# Patient Record
Sex: Female | Born: 1980 | Hispanic: Yes | Marital: Married | Smoking: Never smoker
Health system: Southern US, Community
[De-identification: ages and names within clinical notes are randomized; demographics above are authoritative.]

## PROBLEM LIST (undated history)

## (undated) DIAGNOSIS — Z8742 Personal history of other diseases of the female genital tract: Secondary | ICD-10-CM

## (undated) DIAGNOSIS — O1490 Unspecified pre-eclampsia, unspecified trimester: Secondary | ICD-10-CM

## (undated) DIAGNOSIS — L0291 Cutaneous abscess, unspecified: Secondary | ICD-10-CM

## (undated) DIAGNOSIS — Z789 Other specified health status: Secondary | ICD-10-CM

## (undated) DIAGNOSIS — N83201 Unspecified ovarian cyst, right side: Secondary | ICD-10-CM

## (undated) HISTORY — PX: APPENDECTOMY: SHX54

## (undated) HISTORY — DX: Unspecified ovarian cyst, right side: N83.201

## (undated) HISTORY — DX: Personal history of other diseases of the female genital tract: Z87.42

## (undated) HISTORY — DX: Unspecified pre-eclampsia, unspecified trimester: O14.90

## (undated) HISTORY — DX: Cutaneous abscess, unspecified: L02.91

## (undated) HISTORY — PX: INCISION AND DRAINAGE: SHX5863

---

## 2002-02-27 ENCOUNTER — Encounter: Payer: Self-pay | Admitting: *Deleted

## 2002-02-27 ENCOUNTER — Emergency Department (HOSPITAL_COMMUNITY): Admission: EM | Admit: 2002-02-27 | Discharge: 2002-02-27 | Payer: Self-pay | Admitting: *Deleted

## 2004-05-26 ENCOUNTER — Emergency Department (HOSPITAL_COMMUNITY): Admission: EM | Admit: 2004-05-26 | Discharge: 2004-05-26 | Payer: Self-pay | Admitting: Emergency Medicine

## 2004-06-06 ENCOUNTER — Ambulatory Visit: Payer: Self-pay | Admitting: Nurse Practitioner

## 2004-07-14 ENCOUNTER — Ambulatory Visit: Payer: Self-pay | Admitting: *Deleted

## 2004-08-11 ENCOUNTER — Ambulatory Visit (HOSPITAL_COMMUNITY): Admission: RE | Admit: 2004-08-11 | Discharge: 2004-08-11 | Payer: Self-pay | Admitting: Obstetrics and Gynecology

## 2004-08-18 ENCOUNTER — Ambulatory Visit: Payer: Self-pay | Admitting: Family Medicine

## 2004-09-22 ENCOUNTER — Ambulatory Visit: Payer: Self-pay | Admitting: Family Medicine

## 2004-10-26 ENCOUNTER — Ambulatory Visit: Payer: Self-pay | Admitting: Family Medicine

## 2004-11-30 ENCOUNTER — Ambulatory Visit: Payer: Self-pay | Admitting: Family Medicine

## 2005-01-12 ENCOUNTER — Ambulatory Visit (HOSPITAL_COMMUNITY): Admission: RE | Admit: 2005-01-12 | Discharge: 2005-01-12 | Payer: Self-pay | Admitting: *Deleted

## 2005-04-05 ENCOUNTER — Ambulatory Visit: Payer: Self-pay | Admitting: Family Medicine

## 2005-08-02 ENCOUNTER — Ambulatory Visit: Payer: Self-pay | Admitting: Obstetrics and Gynecology

## 2005-11-02 ENCOUNTER — Ambulatory Visit: Payer: Self-pay | Admitting: Obstetrics & Gynecology

## 2006-08-19 ENCOUNTER — Ambulatory Visit: Payer: Self-pay

## 2010-01-11 ENCOUNTER — Ambulatory Visit: Payer: Self-pay | Admitting: Gynecology

## 2010-01-24 ENCOUNTER — Ambulatory Visit: Payer: Self-pay | Admitting: Gynecology

## 2010-07-07 NOTE — Group Therapy Note (Signed)
NAME:  Eileen [name], Eileen [name] NO.:  [id number]   MEDICAL RECORD NO.:  [id number]          PATIENT TYPE:  WOC   LOCATION:  [facility]                   FACILITY:  [facility]   PHYSICIAN:  Tinnie Gens, MD        DATE OF BIRTH:  1981/01/28   DATE OF SERVICE:                                    CLINIC NOTE   CHIEF COMPLAINT:  Infertility followup.   HISTORY OF PRESENT ILLNESS:  The patient is a 30 year old nulligravida who  has been on Clomid, and had regular cycles since June.  Her LMP is November 24, 2004.  Her partner has undergone semen analysis which has a volume of 1.5  mL, normal pH, normal concentration, good movement and 60% motility and a  total sperm count of 147 million.  There may be increase in abnormal heads,  but otherwise very normal-looking semen analysis.  The patient has not had  her HSG.  She states she was not told to call us about that.  I did offer  her continued treatment with Clomid, but she would definitely want some HSG  at this time.  The patient is instructed to call with her next menses to get  this scheduled.  The patient is also complaining today of some bilateral  heel pain.  When she works, she is on her feet most of the day.  The patient  was instructed about antiinflammatories, heel cups as well as rolling the  ball of her foot over cold cans.  The patient is also complaining of a fall  where she had an injury to her wrist and some back pain.  I have instructed  her that ibuprofen can be used for these sorts of pains and discomforts as  well.  She should continue this for the next week.  If her pain and symptoms  fail to improve, she should see a regular medical doctor.           ______________________________  Tinnie Gens, MD     TP/MEDQ  D:  11/30/2004  T:  11/30/2004  Job:  [id number]

## 2010-07-07 NOTE — Group Therapy Note (Signed)
NAME:  Eileen [name], PORTA NO.:  [id number]   MEDICAL RECORD NO.:  [id number]          PATIENT TYPE:  WOC   LOCATION:  [facility]                   FACILITY:  [facility]   PHYSICIAN:  Tinnie Gens, MD        DATE OF BIRTH:  1981-02-17   DATE OF SERVICE:                                    CLINIC NOTE   CHIEF COMPLAINT:  Infertility followup.   HISTORY OF PRESENT ILLNESS:  The patient is a 30 year old nulligravida, who  has been on Clomid for the past three months.  Since that time, she has had  regular cycles in June, July and August.  However, she has failed to achieve  pregnancy.  She reports that she is having sex during the appropriate time.  We have not done a complete workup on her to include HSG and semen analysis  and that needs to be initiated.   The patient also complains today of sensitivity and possible pain in her  right nipple.  She noticed it at first after she fell on it several months  ago, but it has not really gotten better.   PHYSICAL EXAMINATION:  VITAL SIGNS:  As on the chart.  GENERAL:  She is an obese Hispanic female, in no acute distress.  BREASTS:  Symmetric with everted nipples.  There is no significant pain to  palpation and no masses noted.  There is no axillary or supraclavicular  adenopathy.  ABDOMEN:  Soft, nontender and nondistended.   IMPRESSION:  Primary infertility.   PLAN:  1.  The patient has failed conservative treatment with just Clomid alone,      even though this has induced ovulation per her.  2.  HSG.  The patient is instructed to call if she starts her cycle and we      will make an appointment for an HSG.  3.  The patient's partner is referred for semen analysis.  4.  Followup in 4-6 weeks for results of all of these tests and discussion      of further treatment options.           ______________________________  Tinnie Gens, MD     TP/MEDQ  D:  10/26/2004  T:  10/27/2004  Job:  [id number]

## 2010-07-07 NOTE — Group Therapy Note (Signed)
NAME:  Eileen [name], Eileen [name] NO.:  [id number]   MEDICAL RECORD NO.:  [id number]          PATIENT TYPE:  WOC   LOCATION[facility]                   FACILITY:  [facility]   PHYSICIAN:  Argentina Donovan, MD        DATE OF BIRTH:  1980-03-01   DATE OF SERVICE:  08/02/2005                                    CLINIC NOTE   This patient is a 30 year old nulligravida who has been seen by Dr. Shawnie Pons  for infertility and trying to get pregnant for 6 years.  In discussing with  this patient, who speaks only Spanish, and looking at her temperature chart,  it is obvious that she has not understood well how to proceed.  We are going  to start from the beginning with this lady with a temperature chart with  Glucophage, which she has not taken in spite of being given a prior  prescription for it in the past, b.i.d. 500 and Clomid b.i.d. day 5 through  9.  We will see her back after the second cycle.  If she has not ovulated,  we will try her on 150.  If that has not worked, we will try her with hCG  following with Clomid.  If that does not work, we will send her to someone  else __________ researched into ovulation.  She has a completely flat,  nonovulatory chart, but has been lax in putting down dates and charting  coitus; we have gone over all that with her with data and hopefully she will  understand.  Primary infertility with polycystic ovarian syndrome.  Her  husband has had a normal sperm evaluation.  She has had a normal  hysterosalpingogram.           ______________________________  Argentina Donovan, MD     PR/MEDQ  D:  08/02/2005  T:  08/02/2005  Job:  [id number]

## 2010-07-07 NOTE — Group Therapy Note (Signed)
NAME:  JEYMI, HEPP NO.:  [id number]   MEDICAL RECORD NO.:  [id number]          PATIENT TYPE:  WOC   LOCATION[facility]                   FACILITY:  [facility]   PHYSICIAN:  Kathlyn Sacramento, M.D.   DATE OF BIRTH:  06/01/1980   DATE OF SERVICE:                                    CLINIC NOTE   CHIEF COMPLAINT:  Followup infertility, PCOS.   HISTORY OF PRESENT ILLNESS:  The patient states that she has tried for 6  years to get pregnant.  She has been on Clomid.  She says the last time she  used Clomid was about November.  She does state that she is now having  regular periods.  She is having intercourse regularly.  She states she did  have an HSG, which was negative, and she did have a semen workup for her  partner, which was normal.  The patient had multiple ovarian cysts seen on  an ultrasound in the past.   IMPRESSION:  Polycystic ovarian syndrome.   PLAN:  Discussed with the patient multiple options.  As the patient is  having regular periods, she may be ovulating, so the patient was given a  temperature chart and was told to get a basal temperature thermometer to  determine if she is ovulating.  If she is not ovulating, she was given a  prescription for Clomid to begin to take on days 5-9 of her cycle.  She was  encouraged to continue exercise.  She has lost some weight and she was  encouraged to decrease the amount of sugars and starches she eats in her  diet.  She was also given a prescription for Glucophage, which she was told  to take every day 500 mg 1 p.o. b.i.d.  She is to return in 4-5 months for a  followup.  Plan was discussed with Dr. Shawnie Pons.           ______________________________  Kathlyn Sacramento, M.D.     AC/MEDQ  D:  04/05/2005  T:  04/05/2005  Job:  [id number]
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
## 2010-07-07 NOTE — Group Therapy Note (Signed)
NAME:  Eileen [name], Eileen [name] NO.:  [id number]   MEDICAL RECORD NO.:  [id number]          PATIENT TYPE:  WOC   [facility]ATION[facility]                   [facility]   PHYSICIAN:  Tinnie Gens, MD        DATE OF BIRTH:  [birth year]   DATE OF SERVICE:  08/18/2004                                    CLINIC NOTE   CHIEF COMPLAINT:  Follow-up.   HISTORY OF PRESENT ILLNESS:  The patient is a 30 year old who has irregular  cycles who apparently had some sort of CT that showed bilateral complex  adnexal structures. The patient apparently is trying to get pregnant  currently. At her last visit, a TSH and prolactin were drawn which were  normal. She also underwent sonogram which revealed ovaries with bilateral  increase in size and multiple follicles contained within them consistent  with PCO.   PHYSICAL EXAMINATION TODAY:  VITAL SIGNS:  As noted in the chart.  GENERAL:  She is a well-developed, well-nourished Hispanic female in no  acute distress.  ABDOMEN:  Soft, nontender, nondistended.   IMPRESSION:  1.  Probable polycystic ovary syndrome.  2.  Infertility.   PLAN:  1.  Provera one p.o. daily x10 days to initiate menses.  2.  Clomid 50 mg one p.o. daily x5 days start day #5-9 of her next cycle.  3.  The patient is instructed to have sex between day #9 and day #20.  4.  The patient will return in 5 weeks either having had a cycle which meant      she ovulated or having no cycle and needing a pregnancy test.   I have discussed with her we will try this for a few cycles and if this does  not work she will need further workup including semen analysis and HSG.       TP/MEDQ  D:  08/18/2004  T:  08/18/2004  Job:  [id number]

## 2015-11-08 ENCOUNTER — Other Ambulatory Visit: Payer: Self-pay | Admitting: Adult Health

## 2015-11-16 ENCOUNTER — Ambulatory Visit (INDEPENDENT_AMBULATORY_CARE_PROVIDER_SITE_OTHER): Payer: Self-pay | Admitting: Adult Health

## 2015-11-16 ENCOUNTER — Other Ambulatory Visit (HOSPITAL_COMMUNITY)
Admission: RE | Admit: 2015-11-16 | Discharge: 2015-11-16 | Disposition: A | Discharge status: Home / Self Care | Payer: Self-pay | Source: Ambulatory Visit | Attending: Adult Health | Admitting: Adult Health

## 2015-11-16 ENCOUNTER — Encounter: Payer: Self-pay | Admitting: Adult Health

## 2015-11-16 VITALS — BP 120/80 | HR 74 | Ht 61.0 in | Wt 186.0 lb

## 2015-11-16 DIAGNOSIS — Z01419 Encounter for gynecological examination (general) (routine) without abnormal findings: Secondary | ICD-10-CM | POA: Insufficient documentation

## 2015-11-16 DIAGNOSIS — R1032 Left lower quadrant pain: Secondary | ICD-10-CM

## 2015-11-16 DIAGNOSIS — Z1151 Encounter for screening for human papillomavirus (HPV): Secondary | ICD-10-CM | POA: Insufficient documentation

## 2015-11-16 DIAGNOSIS — N926 Irregular menstruation, unspecified: Secondary | ICD-10-CM

## 2015-11-16 NOTE — Progress Notes (Signed)
Patient ID: Eileen [name], female   DOB: [birth year], 35 y.o.   MRN: [id number] History of Present Illness: Eileen [name] is a 35 year old Hispanic female, married for 16 years and has never been pregnant. Has irregular periods, and was told had PCO years ago. 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   Current Medications, Allergies, Past Medical History, Past Surgical History, Family History and Social History were reviewed in [facility] electronic medical record.     Review of Systems: Patient denies any headaches, hearing loss, fatigue, blurred vision, shortness of breath, chest pain, problems with bowel movements, urination, or intercourse. No joint pain or mood swings. Has creaking in both knees, hair loss and pain in left side at times and has irregular periods, was told had PCOs once.   Physical Exam:BP 120/80   Pulse 74   Ht 5\' 1"  (1.549 m)   Wt 186 lb (84.4 kg)   LMP 11/06/2015   BMI 35.14 kg/m  General:  Well developed, well nourished, no acute distress Skin:  Warm and dry Neck:  Midline trachea, normal thyroid, good ROM, no lymphadenopathy Lungs; Clear to auscultation bilaterally Breast:  No dominant palpable mass, retraction, or nipple discharge Cardiovascular: Regular rate and rhythm Abdomen:  Soft, non tender, no hepatosplenomegaly Pelvic:  External genitalia is normal in appearance, no lesions.  The vagina is normal in appearance. Urethra has no lesions or masses. The cervix is smooth, pap with HPV performed.  Uterus is felt to be normal size, shape, and contour.  No adnexal masses, LLQ tenderness noted.Bladder is non tender, no masses felt. Extremities/musculoskeletal:  No swelling or varicosities noted, no clubbing or cyanosis Psych:  No mood changes, alert and cooperative,seems happy PHQ 2 score 0.  Impression: 1. Encounter for gynecological examination with Papanicolaou smear of cervix   2. LLQ pain   3. Irregular  periods       Plan: GYN US in 2 weeks Physical in 1 year, pap in 3 if normal

## 2015-11-16 NOTE — Addendum Note (Signed)
Addended by: Cyril MourningGRIFFIN, JENNIFER A on: 11/16/2015 01:36 PM   Modules accepted: Orders

## 2015-11-16 NOTE — Patient Instructions (Signed)
US in 2 weeks  Physical  In 1 year, pap in 3 if normal

## 2015-11-16 NOTE — Addendum Note (Signed)
Addended by: Federico FlakeNES, PEGGY A on: 11/16/2015 01:17 PM   Modules accepted: Orders

## 2015-11-17 LAB — CYTOLOGY - PAP

## 2015-11-25 ENCOUNTER — Other Ambulatory Visit: Payer: Self-pay | Admitting: Adult Health

## 2015-11-25 DIAGNOSIS — R1032 Left lower quadrant pain: Secondary | ICD-10-CM

## 2015-11-28 ENCOUNTER — Ambulatory Visit (INDEPENDENT_AMBULATORY_CARE_PROVIDER_SITE_OTHER): Payer: Self-pay

## 2015-11-28 DIAGNOSIS — N854 Malposition of uterus: Secondary | ICD-10-CM

## 2015-11-28 DIAGNOSIS — R1032 Left lower quadrant pain: Secondary | ICD-10-CM

## 2015-11-28 DIAGNOSIS — N83201 Unspecified ovarian cyst, right side: Secondary | ICD-10-CM

## 2015-11-28 NOTE — Progress Notes (Signed)
PELVIC US TA/TV: Homogeneous anteverted uterus,wnl,EEC 13.1 mm,normal left ov,simple right ov cyst 2.7 x 2.2 x 1.5 cm,no free fluid,no pain during ultrasound,ov's appear mobile.

## 2015-11-29 ENCOUNTER — Telehealth: Payer: Self-pay | Admitting: Adult Health

## 2015-11-29 ENCOUNTER — Encounter: Payer: Self-pay | Admitting: Adult Health

## 2015-11-29 DIAGNOSIS — N83201 Unspecified ovarian cyst, right side: Secondary | ICD-10-CM

## 2015-11-29 HISTORY — DX: Unspecified ovarian cyst, right side: N83.201

## 2015-11-29 NOTE — Telephone Encounter (Signed)
Left message to call me about US, was normal except has simple cyst right ovary

## 2015-12-01 ENCOUNTER — Telehealth: Payer: Self-pay | Admitting: Adult Health

## 2015-12-01 NOTE — Telephone Encounter (Signed)
Pt aware US was normal with simple cyst right ovary and left ovary normal

## 2016-01-05 ENCOUNTER — Encounter: Payer: Self-pay | Admitting: Adult Health

## 2016-01-05 ENCOUNTER — Ambulatory Visit (INDEPENDENT_AMBULATORY_CARE_PROVIDER_SITE_OTHER): Payer: Self-pay | Admitting: Adult Health

## 2016-01-05 VITALS — BP 120/70 | HR 84 | Ht 61.0 in | Wt 190.0 lb

## 2016-01-05 DIAGNOSIS — G8929 Other chronic pain: Secondary | ICD-10-CM

## 2016-01-05 DIAGNOSIS — F32A Depression, unspecified: Secondary | ICD-10-CM

## 2016-01-05 DIAGNOSIS — M25561 Pain in right knee: Secondary | ICD-10-CM

## 2016-01-05 DIAGNOSIS — F329 Major depressive disorder, single episode, unspecified: Secondary | ICD-10-CM

## 2016-01-05 DIAGNOSIS — M25562 Pain in left knee: Secondary | ICD-10-CM

## 2016-01-05 DIAGNOSIS — L659 Nonscarring hair loss, unspecified: Secondary | ICD-10-CM

## 2016-01-05 MED ORDER — PIROXICAM 10 MG PO CAPS: 10.0000 mg | ORAL_CAPSULE | Freq: Every day | ORAL | 1 refills | Status: DC

## 2016-01-05 NOTE — Patient Instructions (Signed)
Take feldene 1 daily Take hair, skin,nail vitamin  Follow up in 6 weeks

## 2016-01-05 NOTE — Progress Notes (Signed)
Subjective:     Patient ID: Eileen [name], female   DOB: 06-26-1980, 35 y.o.   MRN: [id number]  HPI Eileen [name] is a 35 year old Hispanic female in complaining of hair loss and knees creak and hurt, she works in house keeping. [facility]   Review of Systems +hair loss + knees creak and hurt  Reviewed past medical,surgical, social and family history. Reviewed medications and allergies.     Objective:   Physical Exam BP 120/70 (BP Location: Left Arm, Patient Position: Sitting, Cuff Size: Normal)   Pulse 84   Ht 5\' 1"  (1.549 m)   Wt 190 lb (86.2 kg)   LMP 12/09/2015 (Exact Date)   BMI 35.90 kg/m Skin warm and dry, has some thinning of hair around face, says her Mom does too.Knees tender to palpation, no crepitus noted today.Will try Feldene to see if helps, if not refer to Dr Hilda LiasKeeling or Romeo AppleHarrison. PHQ 9 score 7, says may be depressed some days but denies any suicidal ideations and declines meds.    Assessment:     1. Chronic pain of both knees   2. Hair loss   3. Depression, unspecified depression type       Plan:     Meds ordered this encounter  Medications  . piroxicam (FELDENE) 10 MG capsule    Sig: Take 1 capsule (10 mg total) by mouth daily.    Dispense:  30 capsule    Refill:  1    Order Specific Question:   Supervising Provider    Answer:   Duane LopeEURE, LUTHER H [[id number]]  Take hair,skin and nail vitamin, OTC,can see dermatologist of desires  Do wear head band Follow up in 6 weeks if knees still hurt, refer to Dr Hilda LiasKeeling or Romeo AppleHarrison

## 2016-02-16 ENCOUNTER — Ambulatory Visit (INDEPENDENT_AMBULATORY_CARE_PROVIDER_SITE_OTHER): Payer: Self-pay | Admitting: Adult Health

## 2016-02-16 ENCOUNTER — Encounter: Payer: Self-pay | Admitting: Adult Health

## 2016-02-16 VITALS — BP 118/62 | HR 74 | Ht 61.0 in | Wt 192.0 lb

## 2016-02-16 DIAGNOSIS — G8929 Other chronic pain: Secondary | ICD-10-CM

## 2016-02-16 DIAGNOSIS — M25562 Pain in left knee: Secondary | ICD-10-CM

## 2016-02-16 DIAGNOSIS — M25561 Pain in right knee: Secondary | ICD-10-CM

## 2016-02-16 DIAGNOSIS — N926 Irregular menstruation, unspecified: Secondary | ICD-10-CM

## 2016-02-16 DIAGNOSIS — Z30011 Encounter for initial prescription of contraceptive pills: Secondary | ICD-10-CM

## 2016-02-16 MED ORDER — NORGESTIM-ETH ESTRAD TRIPHASIC 0.18/0.215/0.25 MG-25 MCG PO TABS: 1.0000 | ORAL_TABLET | Freq: Every day | ORAL | 11 refills | Status: DC

## 2016-02-16 MED ORDER — PIROXICAM 10 MG PO CAPS: 10.0000 mg | ORAL_CAPSULE | Freq: Every day | ORAL | 6 refills | Status: DC

## 2016-02-16 NOTE — Progress Notes (Signed)
Subjective:     Patient ID: Eileen [name], female   DOB: 04/26/1980, 35 y.o.   MRN: [id number]  HPI Eileen [name] is a 35 year old Hispanic female in for follow up of taking feldene for chronic knee pain and she says no pain now, but periods irregular and she wants to start birth control to regulate.   Review of Systems No pain in knees now Periods irregular Reviewed past medical,surgical, social and family history. Reviewed medications and allergies.     Objective:   Physical Exam BP 118/62 (BP Location: Left Arm, Patient Position: Sitting, Cuff Size: Normal)   Pulse 74   Ht 5\' 1"  (1.549 m)   Wt 192 lb (87.1 kg)   LMP 01/04/2016 (Exact Date)   BMI 36.28 kg/m    PHQ 2 score 0. Knees feel much better no pain since taking Feldene.She declines referral to orthopedic. Will rx OCs to see if helps regulate periods.  Assessment:     1. Chronic pain of both knees   2. Irregular periods   3. Encounter for initial prescription of contraceptive pills       Plan:     Meds ordered this encounter  Medications  . piroxicam (FELDENE) 10 MG capsule    Sig: Take 1 capsule (10 mg total) by mouth daily.    Dispense:  30 capsule    Refill:  6    Order Specific Question:   Supervising Provider    Answer:   Despina HiddenEURE, LUTHER H [[id number]]  . Norgestimate-Ethinyl Estradiol Triphasic 0.18/0.215/0.25 MG-25 MCG tab    Sig: Take 1 tablet by mouth daily.    Dispense:  1 Package    Refill:  11    Order Specific Question:   Supervising Provider    Answer:   Duane LopeEURE, LUTHER H [[id number]]  Follow up in 3 months

## 2016-02-21 ENCOUNTER — Encounter: Payer: Self-pay | Admitting: Adult Health

## 2016-02-21 ENCOUNTER — Ambulatory Visit (INDEPENDENT_AMBULATORY_CARE_PROVIDER_SITE_OTHER): Payer: Self-pay | Admitting: Adult Health

## 2016-02-21 VITALS — BP 124/78 | HR 94 | Ht 61.0 in | Wt 190.5 lb

## 2016-02-21 DIAGNOSIS — Z3201 Encounter for pregnancy test, result positive: Secondary | ICD-10-CM

## 2016-02-21 DIAGNOSIS — R1032 Left lower quadrant pain: Secondary | ICD-10-CM

## 2016-02-21 DIAGNOSIS — O3680X0 Pregnancy with inconclusive fetal viability, not applicable or unspecified: Secondary | ICD-10-CM

## 2016-02-21 DIAGNOSIS — Z3490 Encounter for supervision of normal pregnancy, unspecified, unspecified trimester: Secondary | ICD-10-CM

## 2016-02-21 DIAGNOSIS — N926 Irregular menstruation, unspecified: Secondary | ICD-10-CM

## 2016-02-21 DIAGNOSIS — O09519 Supervision of elderly primigravida, unspecified trimester: Secondary | ICD-10-CM

## 2016-02-21 LAB — POCT URINE PREGNANCY: Preg Test, Ur: POSITIVE — AB

## 2016-02-21 MED ORDER — PRENATAL PLUS 27-1 MG PO TABS: 1.0000 | ORAL_TABLET | Freq: Every day | ORAL | 12 refills | Status: DC

## 2016-02-21 NOTE — Patient Instructions (Signed)
First Trimester of Pregnancy  The first trimester of pregnancy is from week 1 until the end of week 12 (months 1 through 3). A week after a sperm fertilizes an egg, the egg will implant on the wall of the uterus. This embryo will begin to develop into a baby. Genes from you and your partner are forming the baby. The female genes determine whether the baby is a boy or a girl. At 6-8 weeks, the eyes and face are formed, and the heartbeat can be seen on ultrasound. At the end of 12 weeks, all the baby's organs are formed.   Now that you are pregnant, you will want to do everything you can to have a healthy baby. Two of the most important things are to get good prenatal care and to follow your health care provider's instructions. Prenatal care is all the medical care you receive before the baby's birth. This care will help prevent, find, and treat any problems during the pregnancy and childbirth.  BODY CHANGES  Your body goes through many changes during pregnancy. The changes vary from woman to woman.   · You may gain or lose a couple of pounds at first.  · You may feel sick to your stomach (nauseous) and throw up (vomit). If the vomiting is uncontrollable, call your health care provider.  · You may tire easily.  · You may develop headaches that can be relieved by medicines approved by your health care provider.  · You may urinate more often. Painful urination may mean you have a bladder infection.  · You may develop heartburn as a result of your pregnancy.  · You may develop constipation because certain hormones are causing the muscles that push waste through your intestines to slow down.  · You may develop hemorrhoids or swollen, bulging veins (varicose veins).  · Your breasts may begin to grow larger and become tender. Your nipples may stick out more, and the tissue that surrounds them (areola) may become darker.  · Your gums may bleed and may be sensitive to brushing and flossing.   · Dark spots or blotches (chloasma, mask of pregnancy) may develop on your face. This will likely fade after the baby is born.  · Your menstrual periods will stop.  · You may have a loss of appetite.  · You may develop cravings for certain kinds of food.  · You may have changes in your emotions from day to day, such as being excited to be pregnant or being concerned that something may go wrong with the pregnancy and baby.  · You may have more vivid and strange dreams.  · You may have changes in your hair. These can include thickening of your hair, rapid growth, and changes in texture. Some women also have hair loss during or after pregnancy, or hair that feels dry or thin. Your hair will most likely return to normal after your baby is born.  WHAT TO EXPECT AT YOUR PRENATAL VISITS  During a routine prenatal visit:  · You will be weighed to make sure you and the baby are growing normally.  · Your blood pressure will be taken.  · Your abdomen will be measured to track your baby's growth.  · The fetal heartbeat will be listened to starting around week 10 or 12 of your pregnancy.  · Test results from any previous visits will be discussed.  Your health care provider may ask you:  · How you are feeling.  · If you   are feeling the baby move.  · If you have had any abnormal symptoms, such as leaking fluid, bleeding, severe headaches, or abdominal cramping.  · If you are using any tobacco products, including cigarettes, chewing tobacco, and electronic cigarettes.  · If you have any questions.  Other tests that may be performed during your first trimester include:  · Blood tests to find your blood type and to check for the presence of any previous infections. They will also be used to check for low iron levels (anemia) and Rh antibodies. Later in the pregnancy, blood tests for diabetes will be done along with other tests if problems develop.  · Urine tests to check for infections, diabetes, or protein in the urine.   · An ultrasound to confirm the proper growth and development of the baby.  · An amniocentesis to check for possible genetic problems.  · Fetal screens for spina bifida and Down syndrome.  · You may need other tests to make sure you and the baby are doing well.  · HIV (human immunodeficiency virus) testing. Routine prenatal testing includes screening for HIV, unless you choose not to have this test.  HOME CARE INSTRUCTIONS   Medicines  · Follow your health care provider's instructions regarding medicine use. Specific medicines may be either safe or unsafe to take during pregnancy.  · Take your prenatal vitamins as directed.  · If you develop constipation, try taking a stool softener if your health care provider approves.  Diet  · Eat regular, well-balanced meals. Choose a variety of foods, such as meat or vegetable-based protein, fish, milk and low-fat dairy products, vegetables, fruits, and whole grain breads and cereals. Your health care provider will help you determine the amount of weight gain that is right for you.  · Avoid raw meat and uncooked cheese. These carry germs that can cause birth defects in the baby.  · Eating four or five small meals rather than three large meals a day may help relieve nausea and vomiting. If you start to feel nauseous, eating a few soda crackers can be helpful. Drinking liquids between meals instead of during meals also seems to help nausea and vomiting.  · If you develop constipation, eat more high-fiber foods, such as fresh vegetables or fruit and whole grains. Drink enough fluids to keep your urine clear or pale yellow.  Activity and Exercise  · Exercise only as directed by your health care provider. Exercising will help you:    Control your weight.    Stay in shape.    Be prepared for labor and delivery.  · Experiencing pain or cramping in the lower abdomen or low back is a good sign that you should stop exercising. Check with your health care provider  before continuing normal exercises.  · Try to avoid standing for long periods of time. Move your legs often if you must stand in one place for a long time.  · Avoid heavy lifting.  · Wear low-heeled shoes, and practice good posture.  · You may continue to have sex unless your health care provider directs you otherwise.  Relief of Pain or Discomfort  · Wear a good support bra for breast tenderness.    · Take warm sitz baths to soothe any pain or discomfort caused by hemorrhoids. Use hemorrhoid cream if your health care provider approves.    · Rest with your legs elevated if you have leg cramps or low back pain.  · If you develop varicose veins in your   legs, wear support hose. Elevate your feet for 15 minutes, 3-4 times a day. Limit salt in your diet.  Prenatal Care  · Schedule your prenatal visits by the twelfth week of pregnancy. They are usually scheduled monthly at first, then more often in the last 2 months before delivery.  · Write down your questions. Take them to your prenatal visits.  · Keep all your prenatal visits as directed by your health care provider.  Safety  · Wear your seat belt at all times when driving.  · Make a list of emergency phone numbers, including numbers for family, friends, the hospital, and police and fire departments.  General Tips  · Ask your health care provider for a referral to a local prenatal education class. Begin classes no later than at the beginning of month 6 of your pregnancy.  · Ask for help if you have counseling or nutritional needs during pregnancy. Your health care provider can offer advice or refer you to specialists for help with various needs.  · Do not use hot tubs, steam rooms, or saunas.  · Do not douche or use tampons or scented sanitary pads.  · Do not cross your legs for long periods of time.  · Avoid cat litter boxes and soil used by cats. These carry germs that can cause birth defects in the baby and possibly loss of the fetus by miscarriage or stillbirth.   · Avoid all smoking, herbs, alcohol, and medicines not prescribed by your health care provider. Chemicals in these affect the formation and growth of the baby.  · Do not use any tobacco products, including cigarettes, chewing tobacco, and electronic cigarettes. If you need help quitting, ask your health care provider. You may receive counseling support and other resources to help you quit.  · Schedule a dentist appointment. At home, brush your teeth with a soft toothbrush and be gentle when you floss.  SEEK MEDICAL CARE IF:   · You have dizziness.  · You have mild pelvic cramps, pelvic pressure, or nagging pain in the abdominal area.  · You have persistent nausea, vomiting, or diarrhea.  · You have a bad smelling vaginal discharge.  · You have pain with urination.  · You notice increased swelling in your face, hands, legs, or ankles.  SEEK IMMEDIATE MEDICAL CARE IF:   · You have a fever.  · You are leaking fluid from your vagina.  · You have spotting or bleeding from your vagina.  · You have severe abdominal cramping or pain.  · You have rapid weight gain or loss.  · You vomit blood or material that looks like coffee grounds.  · You are exposed to German measles and have never had them.  · You are exposed to fifth disease or chickenpox.  · You develop a severe headache.  · You have shortness of breath.  · You have any kind of trauma, such as from a fall or a car accident.     This information is not intended to replace advice given to you by your health care provider. Make sure you discuss any questions you have with your health care provider.     Document Released: 01/30/2001 Document Revised: 02/26/2014 Document Reviewed: 12/16/2012  Elsevier Interactive Patient Education ©2017 [employer]

## 2016-02-21 NOTE — Progress Notes (Signed)
Subjective:     Patient ID: Eileen [name], female   DOB: 30-May-1980, 36 y.o.   MRN: [id number]  HPI Eileen [name] is a 36 year old Hispanic female in for UPT has missed a period and has pain in LLQ at times for about 1 week.Has had 3 +HPT since 02/19/16. No spotting.  Review of Systems +missed period  Pain in LLQ  Reviewed past medical,surgical, social and family history. Reviewed medications and allergies.     Objective:   Physical Exam BP 124/78 (BP Location: Left Arm, Patient Position: Sitting, Cuff Size: Normal)   Pulse 94   Ht 5\' 1"  (1.549 m)   Wt 190 lb 8 oz (86.4 kg)   LMP 01/04/2016 (Approximate)   BMI 35.99 kg/m UPT +, about 6+6 weeks by LMP with EDD 10/10/16.Skin warm and dry. Neck: mid line trachea, normal thyroid, good ROM, no lymphadenopathy noted. Lungs: clear to ausculation bilaterally. Cardiovascular: regular rate and rhythm. Abdomen is soft and has some LLQ tenderness. PHQ 2 score 0.   Stop Feldene now and she agrees.  Assessment:     1. Pregnancy examination or test, positive result   2. Pregnancy, unspecified gestational age   373. LLQ pain   4. Encounter to determine fetal viability of pregnancy, single or unspecified fetus   5. Advanced maternal age, primigravida, antepartum       Plan:     Rx prenatal plus #30 take 1 daily with 12 refills Return in 1 day for dating US Stop Feldene Review handout on first trimester

## 2016-02-22 ENCOUNTER — Other Ambulatory Visit: Payer: Self-pay | Admitting: Adult Health

## 2016-02-22 ENCOUNTER — Telehealth: Payer: Self-pay | Admitting: Obstetrics & Gynecology

## 2016-02-22 ENCOUNTER — Ambulatory Visit (INDEPENDENT_AMBULATORY_CARE_PROVIDER_SITE_OTHER): Payer: Self-pay

## 2016-02-22 DIAGNOSIS — O3680X0 Pregnancy with inconclusive fetal viability, not applicable or unspecified: Secondary | ICD-10-CM

## 2016-02-22 DIAGNOSIS — Z3A01 Less than 8 weeks gestation of pregnancy: Secondary | ICD-10-CM

## 2016-02-22 DIAGNOSIS — O3491 Maternal care for abnormality of pelvic organ, unspecified, first trimester: Secondary | ICD-10-CM

## 2016-02-22 NOTE — Progress Notes (Addendum)
US 5+2 wks GS,no ys seen,simple right exophytic cyst 2.7 x 1.6 x 2.5 cm,normal left ov,no free fluid seen,pt will have a f/u ultrasound in 10 days per Victorino DikeJennifer

## 2016-02-23 ENCOUNTER — Other Ambulatory Visit: Payer: Self-pay

## 2016-02-23 DIAGNOSIS — Z3200 Encounter for pregnancy test, result unknown: Secondary | ICD-10-CM

## 2016-02-23 NOTE — Telephone Encounter (Signed)
Spoke with patient and informed her she needs to have an HCG drawn today if possible.

## 2016-02-24 ENCOUNTER — Ambulatory Visit: Payer: Self-pay | Admitting: Obstetrics and Gynecology

## 2016-02-24 LAB — BETA HCG QUANT (REF LAB): hCG Quant: 2982 m[IU]/mL

## 2016-02-26 ENCOUNTER — Encounter (HOSPITAL_COMMUNITY): Payer: Self-pay | Admitting: Emergency Medicine

## 2016-02-26 ENCOUNTER — Emergency Department (HOSPITAL_COMMUNITY)
Admission: EM | Admit: 2016-02-26 | Discharge: 2016-02-26 | Disposition: A | Discharge status: Home / Self Care | Payer: Self-pay | Attending: Emergency Medicine | Admitting: Emergency Medicine

## 2016-02-26 DIAGNOSIS — Z3A08 8 weeks gestation of pregnancy: Secondary | ICD-10-CM | POA: Insufficient documentation

## 2016-02-26 DIAGNOSIS — L03115 Cellulitis of right lower limb: Secondary | ICD-10-CM | POA: Insufficient documentation

## 2016-02-26 DIAGNOSIS — O99711 Diseases of the skin and subcutaneous tissue complicating pregnancy, first trimester: Secondary | ICD-10-CM | POA: Insufficient documentation

## 2016-02-26 DIAGNOSIS — L0291 Cutaneous abscess, unspecified: Secondary | ICD-10-CM

## 2016-02-26 DIAGNOSIS — L02415 Cutaneous abscess of right lower limb: Secondary | ICD-10-CM | POA: Insufficient documentation

## 2016-02-26 DIAGNOSIS — Z79899 Other long term (current) drug therapy: Secondary | ICD-10-CM | POA: Insufficient documentation

## 2016-02-26 MED ORDER — ACETAMINOPHEN 500 MG PO TABS: 1000.0000 mg | ORAL_TABLET | Freq: Three times a day (TID) | ORAL | 0 refills | Status: AC

## 2016-02-26 MED ORDER — CEPHALEXIN 500 MG PO CAPS
500.0000 mg | ORAL_CAPSULE | Freq: Once | ORAL | Status: AC
  Administered 2016-02-26: 500 mg via ORAL
  Filled 2016-02-26: qty 1

## 2016-02-26 MED ORDER — LIDOCAINE-EPINEPHRINE (PF) 1 %-1:200000 IJ SOLN
20.0000 mL | Freq: Once | INTRAMUSCULAR | Status: AC
  Administered 2016-02-26: 20 mL via INTRADERMAL
  Filled 2016-02-26: qty 30

## 2016-02-26 MED ORDER — CEPHALEXIN 500 MG PO CAPS: 500.0000 mg | ORAL_CAPSULE | Freq: Four times a day (QID) | ORAL | 0 refills | Status: AC

## 2016-02-26 NOTE — ED Provider Notes (Signed)
[facility]-EMERGENCY DEPT Provider Note   CSN: [id number] Arrival date & time: 02/26/16  5621     History   Chief Complaint Chief Complaint  Patient presents with  . Abscess    HPI Eileen [name] is a 36 y.o. female.  The history is provided by the patient.  Abscess  Location:  Leg Leg abscess location:  R upper leg Size:  14 x 8 cm Abscess quality: draining, fluctuance, induration, painful, redness and warmth   Red streaking: no   Duration:  8 days Progression:  Worsening Pain details:    Quality:  Throbbing   Severity:  Severe   Duration:  4 days   Timing:  Constant   Progression:  Worsening Chronicity:  New Context: skin injury (noted ingrown hair last week)   Context: not diabetes, not immunosuppression and not injected drug use   Relieved by:  None tried Worsened by:  Nothing Ineffective treatments:  None tried Associated symptoms: no anorexia, no fatigue, no fever, no nausea and no vomiting   Risk factors: no prior abscess    Of note pt is [redacted] wks pregnant.  Past Medical History:  Diagnosis Date  . Cyst of ovary, right 11/29/2015  . History of PCOS     Patient Active Problem List   Diagnosis Date Noted  . Cyst of ovary, right 11/29/2015    History reviewed. No pertinent surgical history.  OB History    Gravida Para Term Preterm AB Living   1 0 0 0 0 0   SAB TAB Ectopic Multiple Live Births   0 0 0 0 0       Home Medications    Prior to Admission medications   Medication Sig Start Date End Date Taking? Authorizing Provider  acetaminophen (TYLENOL) 500 MG tablet Take 2 tablets (1,000 mg total) by mouth every 8 (eight) hours. Do not take more than 4000 mg of acetaminophen (Tylenol) in a 24-hour period. Please note that other medicines that you may be prescribed may have Tylenol as well. 02/26/16 03/02/16  Nira Conn, MD  cephALEXin (KEFLEX) 500 MG capsule Take 1 capsule (500 mg total) by mouth 4 (four) times daily. 02/26/16 03/04/16  Nira Conn, MD  COLLAGEN PO Take by mouth. Takes 3 in the am    Historical Provider, MD  prenatal vitamin w/FE, FA (PRENATAL 1 + 1) 27-1 MG TABS tablet Take 1 tablet by mouth daily at 12 noon. 02/21/16   Adline Potter, NP    Family History Family History  Problem Relation Age of Onset  . Arthritis/Rheumatoid Father   . Diabetes Mother   . Diabetes Sister     Social History Social History  Substance Use Topics  . Smoking status: Never Smoker  . Smokeless tobacco: Never Used  . Alcohol use No     Comment: sometimes; not now     Allergies   Patient has no known allergies.   Review of Systems Review of Systems  Constitutional: Negative for fatigue and fever.  Gastrointestinal: Negative for anorexia, nausea and vomiting.  Ten systems are reviewed and are negative for acute change except as noted in the HPI    Physical Exam Updated Vital Signs BP 139/80 (BP Location: Left Arm)   Pulse 108   Temp 98 F (36.7 C) (Oral)   Resp 18   Ht 5\' 1"  (1.549 m)   Wt 190 lb (86.2 kg)   LMP 01/13/2016   SpO2 100%   BMI 35.90 kg/m  Physical Exam  Constitutional: She is oriented to person, place, and time. She appears well-developed and well-nourished. No distress.  HENT:  Head: Normocephalic and atraumatic.  Right Ear: External ear normal.  Left Ear: External ear normal.  Nose: Nose normal.  Eyes: Conjunctivae and EOM are normal. No scleral icterus.  Neck: Normal range of motion and phonation normal.  Cardiovascular: Normal rate and regular rhythm.   Pulmonary/Chest: Effort normal. No stridor. No respiratory distress.  Abdominal: She exhibits no distension.  Musculoskeletal: Normal range of motion. She exhibits no edema.  Neurological: She is alert and oriented to person, place, and time.  Skin: She is not diaphoretic.     Psychiatric: She has a normal mood and affect. Her behavior is normal.  Vitals reviewed.    ED Treatments / Results  Labs (all labs  ordered are listed, but only abnormal results are displayed) Labs Reviewed - No data to display  EKG  EKG Interpretation None       Radiology No results found.  Procedures .Marland Kitchen.Incision and Drainage Date/Time: 02/26/2016 8:54 AM Performed by: Nira ConnARDAMA, Pamelia Botto EDUARDO Authorized by: Nira ConnARDAMA, Bryker Fletchall EDUARDO   Consent:    Consent obtained:  Verbal   Consent given by:  Patient   Risks discussed:  Incomplete drainage, pain, infection and bleeding   Alternatives discussed:  Delayed treatment and alternative treatment Location:    Type:  Abscess   Size:  14 x 8cm   Location:  Lower extremity   Lower extremity location:  Leg   Leg location:  R upper leg Pre-procedure details:    Skin preparation:  Betadine Anesthesia (see MAR for exact dosages):    Anesthesia method:  Local infiltration   Local anesthetic:  Lidocaine 1% WITH epi Procedure type:    Complexity:  Complex Procedure details:    Incision types:  Cruciate   Incision depth:  Subcutaneous   Scalpel blade:  11   Wound management:  Probed and deloculated, irrigated with saline and extensive cleaning   Drainage:  Bloody and purulent   Drainage amount:  Moderate   Wound treatment:  Wound left open and drain placed   Packing materials:  1/4 in iodoform gauze Post-procedure details:    Patient tolerance of procedure:  Tolerated well, no immediate complications   (including critical care time) Emergency Focused Ultrasound Exam Limited Ultrasound of Soft Tissue   Performed and interpreted by Dr. Eudelia Bunchardama Indication: evaluation for infection or foreign body Transverse and Sagittal views of right upper leg are obtained in real time for the purposes of evaluation of skin and underlying soft tissues.  Findings: + heterogeneous fluid collection, + hyperemia/edema of surrounding tissue Interpretation: abscess with overlying cellulitis  Images archived electronically.  CPT Codes:   Lower extremity [phone]   Medications  Ordered in ED Medications  lidocaine-EPINEPHrine (XYLOCAINE-EPINEPHrine) 1 %-1:200000 (PF) injection 20 mL (20 mLs Intradermal Given by Other 02/26/16 0837)  cephALEXin (KEFLEX) capsule 500 mg (500 mg Oral Given 02/26/16 0939)     Initial Impression / Assessment and Plan / ED Course  I have reviewed the triage vital signs and the nursing notes.  Pertinent labs & imaging results that were available during my care of the patient were reviewed by me and considered in my medical decision making (see chart for details).  Clinical Course     Large abscess with surrounding erythema. I&D as above. Given Keflex here and will DC with Rx for the same. Close follow up here or with PCP for wound check.  Patient was instructed to have iodoform packing removed in 2 days. Drains can be removed in 5 days at home or by PCP.   The patient is safe for discharge with strict return precautions.  Final Clinical Impressions(s) / ED Diagnoses   Final diagnoses:  Abscess  Cellulitis of right lower extremity   Disposition: Discharge  Condition: Good  I have discussed the results, Dx and Tx plan with the patient who expressed understanding and agree(s) with the plan. Discharge instructions discussed at great length. The patient was given strict return precautions who verbalized understanding of the instructions. No further questions at time of discharge.    Discharge Medication List as of 02/26/2016  9:40 AM    START taking these medications   Details  acetaminophen (TYLENOL) 500 MG tablet Take 2 tablets (1,000 mg total) by mouth every 8 (eight) hours. Do not take more than 4000 mg of acetaminophen (Tylenol) in a 24-hour period. Please note that other medicines that you may be prescribed may have Tylenol as well., Starting Sun 02/26/2016, Un til Fri 03/02/2016, Print    cephALEXin (KEFLEX) 500 MG capsule Take 1 capsule (500 mg total) by mouth 4 (four) times daily., Starting Sun 02/26/2016, Until Sun 03/04/2016,  Print        Follow Up: [facility] EMERGENCY DEPARTMENT [address] [id number] [address] [address] [phone] Go in 2 days For wound re-check      Nira Conn, MD 02/26/16 1056

## 2016-02-26 NOTE — ED Triage Notes (Signed)
Patient c/o abscess to right hip. Per patient abscess x4 days with drainage and fever. Patient is 2 months pregnant per husband. Denies any medications.

## 2016-02-26 NOTE — ED Notes (Signed)
Pt made aware to return if symptoms worsen or if any life threatening symptoms occur.   

## 2016-02-26 NOTE — ED Notes (Signed)
MD at bedside. 

## 2016-02-26 NOTE — ED Notes (Signed)
Eileen [name] explained discharge instructions to pt and husband.

## 2016-02-28 ENCOUNTER — Telehealth: Payer: Self-pay | Admitting: *Deleted

## 2016-02-28 ENCOUNTER — Other Ambulatory Visit: Payer: Self-pay

## 2016-02-28 DIAGNOSIS — O200 Threatened abortion: Secondary | ICD-10-CM

## 2016-02-28 NOTE — Telephone Encounter (Signed)
Appointment made for tomorrow to draw HcG

## 2016-02-29 ENCOUNTER — Ambulatory Visit (INDEPENDENT_AMBULATORY_CARE_PROVIDER_SITE_OTHER): Payer: Medicaid Other | Admitting: Adult Health

## 2016-02-29 ENCOUNTER — Other Ambulatory Visit: Payer: Self-pay | Admitting: Obstetrics and Gynecology

## 2016-02-29 ENCOUNTER — Ambulatory Visit: Payer: Self-pay | Admitting: Obstetrics and Gynecology

## 2016-02-29 ENCOUNTER — Encounter: Payer: Self-pay | Admitting: Adult Health

## 2016-02-29 DIAGNOSIS — Z331 Pregnant state, incidental: Secondary | ICD-10-CM | POA: Diagnosis not present

## 2016-02-29 DIAGNOSIS — L02415 Cutaneous abscess of right lower limb: Secondary | ICD-10-CM | POA: Diagnosis not present

## 2016-02-29 DIAGNOSIS — M79651 Pain in right thigh: Secondary | ICD-10-CM

## 2016-02-29 DIAGNOSIS — L0291 Cutaneous abscess, unspecified: Secondary | ICD-10-CM

## 2016-02-29 HISTORY — DX: Cutaneous abscess, unspecified: L02.91

## 2016-02-29 LAB — BETA HCG QUANT (REF LAB): hCG Quant: 3724 m[IU]/mL

## 2016-02-29 MED ORDER — HYDROCODONE-ACETAMINOPHEN 5-325 MG PO TABS: 1.0000 | ORAL_TABLET | Freq: Four times a day (QID) | ORAL | 0 refills | Status: DC | PRN

## 2016-02-29 NOTE — Progress Notes (Signed)
Subjective:     Patient ID: Eileen [name], female   DOB: [birth year], 36 y.o.   MRN: [id number]  HPI Eileen [name] is a 36 year old Hispanic in for ER follow up of having abscesss drained 02/26/16 and placed on antibiotics, still has pain.Pt is pregnant.  Review of Systems Pain right upper thigh, has abscess. Reviewed past medical,surgical, social and family history. Reviewed medications and allergies.     Objective:   Physical Exam BP 135/68 (BP Location: Left Arm, Patient Position: Sitting, Cuff Size: Normal)   Pulse 87   Ht 5\' 1"  (1.549 m)   Wt 193 lb 8 oz (87.8 kg)   LMP 01/13/2016   BMI 36.56 kg/m   PHQ 2 score 0. Skin warm and dry, dressing removed right thigh, and has large thick, purple area with drains and purulent drainage where I&D was performed 1/7 in ER.She is taking antibiotics but it is still tender.Dr Emelda FearFerguson brought in and he is going to I&D new area and clean area up with possible referral to PT.   Pt aware QHCG rising but not doubling to get US Friday in F/U.  Assessment:     Abscess     Plan:     See Dr Emelda FearFerguson now for I&D of abscess right upper thigh   Get US Friday

## 2016-02-29 NOTE — Progress Notes (Addendum)
Eileen [name] is a 36 y.o. female was seen by Cyril MourningJennifer Griffin today. Is here for I&D of abscess.  See note by JAG.  She had 2 tubes sewn in to the incision ; these are removed. Chart review: no cultures of wound noted in chart.  INCISION AND DRAINAGE PROCEDURE NOTE: Patient identification was confirmed and verbal consent was obtained. This procedure was performed by Tilda BurrowJohn V Nguyet Mercer, MD at 12:50 PM. Site: anterior right upper thigh Sterile procedures observed Needle size: 25 gauge  Anesthetic used (type and amt): 2% lidocaine without epi, 20 cc Blade size: 11 Drainage: ~ 50 cc of bloody purulent material and necrotic fatty tissue Complexity: Complex Packing used: 1/2" iodoform gauze Site anesthetized, incision made over site of continued fluctuance which is MEDIAL TO EXISTING WOUND DRAIN SITE., wound drained and explored loculations, ane stips of necrotic fat extracted from wound, rinsed with copious amounts of normal saline, debrided with gauze on hemostat,, wound packed with sterile iodoform gauze, covered with dry, sterile dressing.  Pt tolerated procedure well without complications.  Instructions for care discussed verbally and pt provided with additional written instructions for homecare and f/u. With physical therapy for irrigation and removal of iodoform wicks in a.m Thursday      Assessment: inferior to the inguinal crease there is large area of cellulitis 15 cm in length x 8 cm wide up to inguinal crease.   Plan: follow up tomorrow with physical therapy for removal of packing and irrigation  continue antibiotics.(Keflex) but will add clindamycin 300mg  q 6h.  Note to Phys Tx: please notify pt of additional meds Rx.  Pt also needs f/u here to confirm viabilitiy of pregnancy( HCG's rising slowly)  By signing my name below, I, Sonum Patel, attest that this documentation has been prepared under the direction and in the presence of Tilda BurrowJohn V Shazia Mitchener, MD. Electronically Signed:  Sonum Patel, Neurosurgeoncribe. 02/29/16. 12:50 PM.  I personally performed the services described in this documentation, which was SCRIBED in my presence. The recorded information has been reviewed and considered accurate. It has been edited as necessary during review. Tilda BurrowFERGUSON,Theodoros Stjames V, MD

## 2016-03-01 ENCOUNTER — Ambulatory Visit (HOSPITAL_COMMUNITY): Payer: Self-pay | Attending: Obstetrics and Gynecology | Admitting: Physical Therapy

## 2016-03-01 ENCOUNTER — Other Ambulatory Visit: Payer: Self-pay | Admitting: Obstetrics & Gynecology

## 2016-03-01 DIAGNOSIS — L02415 Cutaneous abscess of right lower limb: Secondary | ICD-10-CM | POA: Insufficient documentation

## 2016-03-01 DIAGNOSIS — O3680X0 Pregnancy with inconclusive fetal viability, not applicable or unspecified: Secondary | ICD-10-CM

## 2016-03-01 DIAGNOSIS — R262 Difficulty in walking, not elsewhere classified: Secondary | ICD-10-CM | POA: Insufficient documentation

## 2016-03-01 MED ORDER — CLINDAMYCIN HCL 300 MG PO CAPS: 300.0000 mg | ORAL_CAPSULE | Freq: Four times a day (QID) | ORAL | 0 refills | Status: AC

## 2016-03-01 NOTE — Progress Notes (Signed)
   03/01/16 1121  Subjective Assessment  Subjective PT pain began on 02/22/2016.  She went to the ER on 02/26/2016 and the MD performed and I&D  Patient and Family Stated Goals wound to heal   Date of Onset 02/22/16  Prior Treatments I&D in ER and MD office   Pain Assessment  Pain Assessment No/denies pain  Pain Score (during treatment increased to an 7-8/10(pulling packing out))  Pain Location Leg (discoloration of skin with induration is 17x4.5. 4 incisions)  Pain Orientation Right;Upper  Pain Descriptors / Indicators Throbbing  Evaluation and Treatment  Evaluation and Treatment Procedures Explained to Patient/Family Yes  Evaluation and Treatment Procedures agreed to  Wound Therapy - Assess/Plan/Recommendations  Wound Therapy - Clinical Statement Ms. Palacios-Benites is a 36 yo female who is 2 months pregnant.  On 02/22/2015 she began having progressive Rt upper leg pain.  By 02/26/2016 she went to the ER.  Her Rt upper leg had an I&D and she is now referred to skilled physical therapy to remove the packing and irrigate.  [name] will benefit from skilled PT to continue a healing environement and prevent infection.   Wound Therapy - Functional Problem List pain causing difficulty in walking.   Factors Delaying/Impairing Wound Healing Infection - systemic/local  Hydrotherapy Plan Debridement;Dressing change;Patient/family education;Pulsatile lavage with suction  Wound Therapy - Frequency (2x a week )  Wound Therapy - Current Recommendations PT  Wound Plan Pt to be seen twice a week for pulse lavage followed by any mechanical debridement needed and dressing change.   Wound Therapy  Dressing  2" kling soaked in saline with hydrogel gently packed into the 4 incisions followed by 4x4, Abpad and tape with netting to secure the dressing.

## 2016-03-01 NOTE — Therapy (Signed)
[facility] [address] [place], [place], [address] Phone: [phone]   Fax:  [phone]  Wound Care Evaluation  Patient Details  Name: Eileen [name] MRN: [id number] Date of Birth: Jul 14, 1980 Referring Provider: Christin Bach  Encounter Date: 03/01/2016      PT End of Session - 03/01/16 1218    Visit Number 1   Number of Visits 8   Date for PT Re-Evaluation 03/31/16   Authorization Type no insurance   Authorization - Visit Number 1   Authorization - Number of Visits 8   PT Start Time 0950   PT Stop Time 1047   PT Time Calculation (min) 57 min   Activity Tolerance Patient tolerated treatment well   Behavior During Therapy [facility] for tasks assessed/performed      Past Medical History:  Diagnosis Date  . Abscess 02/29/2016  . Cyst of ovary, right 11/29/2015  . History of PCOS     Past Surgical History:  Procedure Laterality Date  . INCISION AND DRAINAGE     right outer leg    There were no vitals filed for this visit.      Subjective Assessment - 03/01/16 1115    Subjective Eileen [name] states that she is two month pregnant.  She began having pain in her right upper leg on 02/22/2016.  By 02/26/2016 her pain was so severe that she went to the ER.  Due to her pregnancy her MD, Dr. Emelda Fear was called who completed an I &D.  She is now being referred for removal of the packing and irrigation of the wound.     Pertinent History + pregnancy    How long can you sit comfortably? no problem   How long can you walk comfortably? less than 10 minutes    Currently in Pain? --  no initial pain after treatment pt stated pain increased to 8/10.   Pain Location Leg   Pain Orientation Right;Upper   Pain Descriptors / Indicators Throbbing          [facility] PT Assessment - 03/01/16 0001      Assessment   Medical Diagnosis Rt thigh abscess   Referring Provider Christin Bach   Onset Date/Surgical Date 02/22/16   Prior Therapy  none     Precautions   Precautions None     Restrictions   Weight Bearing Restrictions No     Balance Screen   Has the patient fallen in the past 6 months No   Has the patient had a decrease in activity level because of a fear of falling?  Yes   Is the patient reluctant to leave their home because of a fear of falling?  No     Home Environment   Living Environment Private residence     Prior Function   Level of Independence Independent   Vocation Full time employment   Vocation Requirements housekeeping     Cognition   Overall Cognitive Status Within Functional Limits for tasks assessed         Wound Therapy - 03/01/16 1121    Subjective PT pain began on 02/22/2016.  She went to the ER on 02/26/2016 and the MD performed and I&D   Patient and Family Stated Goals wound to heal    Date of Onset 02/22/16   Prior Treatments I&D in ER and MD office    Pain Assessment No/denies pain   Pain Score --  during treatment increased to an 7-8/10(pulling packing out)  Pain Location Leg  discoloration of skin with induration is 17x4.5. 4 incisions   Pain Orientation Right;Upper   Pain Descriptors / Indicators Throbbing   Evaluation and Treatment Procedures Explained to Patient/Family Yes   Evaluation and Treatment Procedures agreed to   Wound Therapy - Clinical Statement Eileen [name] is a 36 yo female who is 2 months pregnant.  On 02/22/2015 she began having progressive Rt upper leg pain.  By 02/26/2016 she went to the ER.  Her Rt upper leg had an I&D and she is now referred to skilled physical therapy to remove the packing and irrigate.  Eileen [name] will benefit from skilled PT to continue a healing environement and prevent infection.    Wound Therapy - Functional Problem List pain causing difficulty in walking.    Factors Delaying/Impairing Wound Healing Infection - systemic/local   Hydrotherapy Plan Debridement;Dressing change;Patient/family education;Pulsatile lavage with suction    Wound Therapy - Frequency --  2x a week    Wound Therapy - Current Recommendations PT   Wound Plan Pt to be seen twice a week for pulse lavage followed by any mechanical debridement needed and dressing change.    Dressing  2" kling soaked in saline with hydrogel gently packed into the 4 incisions followed by 4x4, Abpad and tape with netting to secure the dressing.                          PT Education - 03/01/16 1215    Education provided Yes   Education Details keep the current dressing in place unless pt notices increased drainage then cleanse and redress the area.    Person(s) Educated Patient   Methods Explanation   Comprehension Verbalized understanding          PT Short Term Goals - 03/01/16 1224      PT SHORT TERM GOAL #1   Title Pt to be able to verbalize signs of infection and verbalize that she must contact her MD immediately.    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    Time 4   Period Weeks   Status New              Plan - 03/01/16 1219    Clinical Impression Statement as above    Rehab Potential Good   PT Frequency 2x / week   PT Duration 4 weeks   PT Treatment/Interventions ADLs/Self Care Home Management;Other (comment)  debridement and dressing change    PT Next Visit Plan continue with pulse lavage, debridement as necessary and dressing change.    Consulted and Agree with Plan of Care Patient      Patient will benefit from skilled therapeutic intervention in order to improve the following deficits and impairments:  Pain,  Other (comment)  Visit  Diagnosis: Difficulty in walking, not elsewhere classified  Abscess of right thigh    Problem List Patient Active Problem List   Diagnosis Date Noted  . Abscess of right thigh 03/01/2016  . Abscess 02/29/2016  . Cyst of ovary, right 11/29/2015    Virgina Organ, PT CLT [phone] 03/01/2016, 12:40 PM  [facility] [address] [place], [place], [address] Phone: [phone]   Fax:  [phone]  Name: Eileen [name] MRN: [id number] Date of Birth: 01/21/1981

## 2016-03-01 NOTE — Patient Instructions (Signed)
Physical Therapy appointment Thursday am 9: 30

## 2016-03-02 ENCOUNTER — Ambulatory Visit (INDEPENDENT_AMBULATORY_CARE_PROVIDER_SITE_OTHER): Payer: Self-pay

## 2016-03-02 ENCOUNTER — Other Ambulatory Visit: Payer: Self-pay | Admitting: Obstetrics & Gynecology

## 2016-03-02 DIAGNOSIS — O3680X0 Pregnancy with inconclusive fetal viability, not applicable or unspecified: Secondary | ICD-10-CM

## 2016-03-02 DIAGNOSIS — Z3A01 Less than 8 weeks gestation of pregnancy: Secondary | ICD-10-CM

## 2016-03-02 NOTE — Progress Notes (Signed)
US 5+4 wks fetal pole w/ys,NO fht seen,crl 2.8 mm,normal ovaries bilat,simple right adnexal cyst 2.2 x 2.2 x 1.8 cm (separate for ovary),Dr Emelda FearFerguson discussed results with pt.

## 2016-03-05 ENCOUNTER — Ambulatory Visit (HOSPITAL_COMMUNITY): Payer: Self-pay | Admitting: Physical Therapy

## 2016-03-05 DIAGNOSIS — L02415 Cutaneous abscess of right lower limb: Secondary | ICD-10-CM

## 2016-03-05 DIAGNOSIS — R262 Difficulty in walking, not elsewhere classified: Secondary | ICD-10-CM

## 2016-03-05 NOTE — Therapy (Signed)
[facility] [address] [place], [place], [address] Phone: [phone]   Fax:  [phone]  Wound Care Therapy  Patient Details  Name: Eileen [name] MRN: [id number] Date of Birth: [birth year] Referring Provider: Christin Bach  Encounter Date: 03/05/2016      PT End of Session - 03/05/16 1123    Visit Number 2   Number of Visits 8   Date for PT Re-Evaluation 03/31/16   Authorization Type no insurance   Authorization - Visit Number 2   Authorization - Number of Visits 8   PT Start Time 1033   PT Stop Time 1113   PT Time Calculation (min) 40 min   Activity Tolerance Patient tolerated treatment well   Behavior During Therapy [facility] for tasks assessed/performed      Past Medical History:  Diagnosis Date  . Abscess 02/29/2016  . Cyst of ovary, right 11/29/2015  . History of PCOS     Past Surgical History:  Procedure Laterality Date  . INCISION AND DRAINAGE     right outer leg    There were no vitals filed for this visit.                  Wound Therapy - 03/05/16 1116    Subjective Patient arrives today with interpreter, pleasant and reports no pain. Her dressing came off and her friend who is a CNA helped her put a fresh one on.    Patient and Family Stated Goals wound to heal    Date of Onset 02/22/16   Prior Treatments I&D in ER and MD office    Pain Assessment No/denies pain   Evaluation and Treatment Procedures Explained to Patient/Family Yes   Evaluation and Treatment Procedures agreed to   Wound Therapy - Clinical Statement Patient arrives with interpreter, pleasant and willing to receive skilled wound care treatment. Her dressing came off before and htey repllaced it at home. Removed dressing and packing from all wounds today, with pain decreased druing procedures today. Performed pulsed lavage to all wound sites and then proceeded to debride dry skin surrounding wound beds. Moistened plain gauze with  hydrogel and packed as far as possible into all wound beds, with tenderness noted during this part. Gave patient a roll of cloth tape and advisted her to use this instead of other tape on her skin due to difficulty in taking it off. Encouraged her to call her MD if she is curious about when she can return to work.    Wound Therapy - Functional Problem List pain causing difficulty in walking.    Factors Delaying/Impairing Wound Healing Infection - systemic/local   Hydrotherapy Plan Debridement;Dressing change;Patient/family education;Pulsatile lavage with suction   Wound Therapy - Frequency Other (comment)  2x/week    Wound Therapy - Current Recommendations PT   Wound Plan Pt to be seen twice a week for pulse lavage followed by any mechanical debridement needed and dressing change.    Dressing  hydrogel moistened gauze packed into all wound beds, ABD pad, medipore                  PT Education - 03/05/16 1123    Education provided Yes   Education Details keep current dressing on unless it comes off by itself or drainage comes through; call MD for specifics about return to work; importance of packing wounds to prevent abscess reformation    Person(s) Educated Patient   Methods Explanation   Comprehension Verbalized understanding  PT Short Term Goals - 03/01/16 1224      PT SHORT TERM GOAL #1   Title Pt to be able to verbalize signs of infection and verbalize that she must contact her MD immediately.    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    Time 4   Period  Weeks   Status New             Patient will benefit from skilled therapeutic intervention in order to improve the following deficits and impairments:     Visit Diagnosis: Difficulty in walking, not elsewhere classified  Abscess of right thigh     Problem List Patient Active Problem List   Diagnosis Date Noted  . Abscess of right thigh 03/01/2016  . Abscess 02/29/2016  . Cyst of ovary, right 11/29/2015    Nedra HaiKristen Coalton Arch PT, DPT [phone]  [facility] [address] [place], [place], [address] Phone: [phone]   Fax:  [phone]  Name: Eileen [name] MRN: [id number] Date of Birth: 1980/10/10

## 2016-03-08 ENCOUNTER — Ambulatory Visit (HOSPITAL_COMMUNITY): Payer: Self-pay | Admitting: Physical Therapy

## 2016-03-09 ENCOUNTER — Ambulatory Visit (HOSPITAL_COMMUNITY): Payer: Self-pay | Admitting: Physical Therapy

## 2016-03-09 DIAGNOSIS — L02415 Cutaneous abscess of right lower limb: Secondary | ICD-10-CM

## 2016-03-09 DIAGNOSIS — R262 Difficulty in walking, not elsewhere classified: Secondary | ICD-10-CM

## 2016-03-09 NOTE — Therapy (Signed)
[place], [place], [address] Phone: [phone]   Fax:  [phone]  Wound Care Therapy  Patient Details  Name: Eileen [name] MRN: [id number] Date of Birth: [birth year] Referring Provider: Mallory Shirk  Encounter Date: 03/09/2016      PT End of Session - 03/09/16 0947    Visit Number 3   Number of Visits 8   Date for PT Re-Evaluation 03/31/16   Authorization Type no insurance   Authorization - Visit Number 3   Authorization - Number of Visits 8   PT Start Time 0910   PT Stop Time 0940   PT Time Calculation (min) 30 min   Activity Tolerance Patient tolerated treatment well   Behavior During Therapy [facility] for tasks assessed/performed      Past Medical History:  Diagnosis Date  . Abscess 02/29/2016  . Cyst of ovary, right 11/29/2015  . History of PCOS     Past Surgical History:  Procedure Laterality Date  . INCISION AND DRAINAGE     right outer leg    There were no vitals filed for this visit.                  Wound Therapy - 03/09/16 0944    Subjective No pain; doing well    Patient and Family Stated Goals wound to heal    Date of Onset 02/22/16   Prior Treatments I&D in ER and MD office    Pain Assessment No/denies pain   Evaluation and Treatment Procedures Explained to Patient/Family Yes   Evaluation and Treatment Procedures agreed to   Wound Therapy - Clinical Statement Patient arrives with interpreter, pleasant and willing to receive skilled wound care treatment. Her dressing came off before and htey repllaced it at home. Removed dressing and packing from all wounds today, with pain decreased druing procedures today. Performed pulsed lavage to all wound sites and then proceeded to debride dry skin surrounding wound beds. Moistened plain gauze with hydrogel and packed as far as possible into all wound beds, with tenderness noted during this part. Gave patient a roll of cloth  tape and advisted her to use this instead of other tape on her skin due to difficulty in taking it off. Encouraged her to call her MD if she is curious about when she can return to work.    Wound Therapy - Functional Problem List pain causing difficulty in walking.    Factors Delaying/Impairing Wound Healing Infection - systemic/local   Hydrotherapy Plan Debridement;Dressing change;Patient/family education;Pulsatile lavage with suction   Wound Therapy - Frequency Other (comment)  2x/week    Wound Therapy - Current Recommendations PT   Wound Plan Pt to be seen twice a week for pulse lavage followed by any mechanical debridement needed and dressing change.    Dressing  hydrogel moistened gauze packed into all wound beds, ABD pad, medipore      Area surrounding wounds cleansed and moisturized prior to pulse lavage.  Pt received pulse lavage to wound sites.   All wound sites granulated.  Most lateral wound is healed.               PT Short Term Goals - 03/09/16 0950      PT SHORT TERM GOAL #1   Title Pt to be able to verbalize signs of infection and verbalize that she must contact her MD immediately.    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    Time 4   Period Weeks   Status Achieved               Plan - 03/09/16 0948    Clinical Impression Statement Pt moist lateral opening has now closed. Wound beds are 100% granulated for the remaining three openings.     Rehab Potential Good   PT Frequency 2x / week   PT Duration 4 weeks   PT  Treatment/Interventions ADLs/Self Care Home Management;Other (comment)  debridement and dressing change    PT Next Visit Plan may discontinue pulse lavage,irrigate and  debride as necessary and dressing change.    Consulted and Agree with Plan of Care Patient      Patient will benefit from skilled therapeutic intervention in order to improve the following deficits and impairments:  Pain, Other (comment)  Visit Diagnosis: Difficulty in walking, not elsewhere classified  Abscess of right thigh     Problem List Patient Active Problem List   Diagnosis Date Noted  . Abscess of right thigh 03/01/2016  . Abscess 02/29/2016  . Cyst of ovary, right 11/29/2015  Rayetta Humphrey, PT CLT [phone] 03/09/2016, 9:51 AM  [place], [place], [address] Phone: [phone]   Fax:  [phone]  Name: Phyllistine Domingos MRN: [id number] Date of Birth: 16-Jun-1980

## 2016-03-12 ENCOUNTER — Encounter: Payer: Self-pay | Admitting: Obstetrics and Gynecology

## 2016-03-12 ENCOUNTER — Ambulatory Visit (HOSPITAL_COMMUNITY): Payer: Self-pay | Admitting: Physical Therapy

## 2016-03-12 ENCOUNTER — Ambulatory Visit (INDEPENDENT_AMBULATORY_CARE_PROVIDER_SITE_OTHER): Payer: Medicaid Other | Admitting: Obstetrics and Gynecology

## 2016-03-12 VITALS — BP 130/70 | HR 88 | Wt 191.0 lb

## 2016-03-12 DIAGNOSIS — O039 Complete or unspecified spontaneous abortion without complication: Secondary | ICD-10-CM | POA: Diagnosis not present

## 2016-03-12 DIAGNOSIS — L02415 Cutaneous abscess of right lower limb: Secondary | ICD-10-CM | POA: Diagnosis not present

## 2016-03-12 DIAGNOSIS — N938 Other specified abnormal uterine and vaginal bleeding: Secondary | ICD-10-CM

## 2016-03-12 DIAGNOSIS — R262 Difficulty in walking, not elsewhere classified: Secondary | ICD-10-CM

## 2016-03-12 DIAGNOSIS — Z8759 Personal history of other complications of pregnancy, childbirth and the puerperium: Secondary | ICD-10-CM | POA: Insufficient documentation

## 2016-03-12 NOTE — Therapy (Signed)
[place], [place], [address] Phone: [phone]   Fax:  [phone]  Wound Care Therapy  Patient Details  Name: Eileen [name] MRN: [id number] Date of Birth: 1980-09-25 Referring Provider: Mallory Shirk  Encounter Date: 03/12/2016      PT End of Session - 03/12/16 0845    Visit Number 4   Number of Visits 8   Date for PT Re-Evaluation 03/31/16   Authorization Type no insurance   Authorization - Visit Number 4   Authorization - Number of Visits 8   PT Start Time 0820   PT Stop Time 0840   PT Time Calculation (min) 20 min   Activity Tolerance Patient tolerated treatment well   Behavior During Therapy [facility] for tasks assessed/performed      Past Medical History:  Diagnosis Date  . Abscess 02/29/2016  . Cyst of ovary, right 11/29/2015  . History of PCOS     Past Surgical History:  Procedure Laterality Date  . INCISION AND DRAINAGE     right outer leg    There were no vitals filed for this visit.       Subjective Assessment - 03/12/16 0844    Subjective Pt reports she is no longer pregnant (per interpreter)   Patient is accompained by: Interpreter   Currently in Pain? No/denies                   Wound Therapy - 03/12/16 0839    Subjective No pain or complaints   Patient and Family Stated Goals wound to heal    Date of Onset 02/22/16   Prior Treatments I&D in ER and MD office    Pain Assessment No/denies pain   Evaluation and Treatment Procedures Explained to Patient/Family Yes   Evaluation and Treatment Procedures agreed to   Wound Therapy - Clinical Statement Pt arrives with interpreter today Allena Katz) who sat outside the privacy curtain during treatment.  Pt without pain, however does report some discomfort with cleaning and repacking of wound.  Continues to be 100% granulated.  Received irrigation and repacking with hydrogel gauze.  ABD and medipore tape used to secure  wound.    Wound Therapy - Functional Problem List pain causing difficulty in walking.    Factors Delaying/Impairing Wound Healing Infection - systemic/local   Hydrotherapy Plan Debridement;Dressing change;Patient/family education;Pulsatile lavage with suction   Wound Therapy - Frequency Other (comment)  2x/week    Wound Therapy - Current Recommendations PT   Wound Plan Pt to be seen twice a week for irrigation, debridement and appropriate dressing.   Dressing  hydrogel moistened gauze packed into all wound beds, ABD pad, medipore                    PT Short Term Goals - 03/09/16 0950      PT SHORT TERM GOAL #1   Title Pt to be able to verbalize signs of infection and verbalize that she must contact her MD immediately.    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    Time 4   Period Weeks   Status Achieved             Patient will benefit from skilled therapeutic intervention in order to improve the following deficits and impairments:     Visit Diagnosis: Difficulty in walking, not elsewhere classified  Abscess of right thigh     Problem List Patient Active Problem List   Diagnosis Date Noted  . Abscess of right thigh 03/01/2016  . Abscess 02/29/2016  . Cyst of ovary, right 11/29/2015    Teena Irani, PTA/CLT [phone]  03/12/2016, 8:46 AM  [place], [place], [address] Phone: [phone]   Fax:  [phone]  Name: Eileen [name] MRN:  [id number] Date of Birth: January 29, 1981

## 2016-03-12 NOTE — Progress Notes (Signed)
Patient ID: Eileen [name], female   DOB: [birth year], 36 y.o.   MRN: [id number]  Language interpreter used   [facility]  @DATE @            Patient name: Eileen [name] MRN [id number]  Date of birth: [birth year]  CC & HPI:   Chief Complaint  Patient presents with  . Follow-up    abscess right thigh     Fareeha [name] is a 36 y.o. female presenting today for wound check of abscess to right upper thigh that was drained 12 days ago. Pt had the wound cleaned 3 days ago and reports her pain has since resolved. She denies fever.   Pt states she has begun to have vaginal bleeding since recent SAB.   ROS:  ROS No complaints, wound check  Pertinent History Reviewed:   Reviewed Medical         Past Medical History:  Diagnosis Date  . Abscess 02/29/2016  . Cyst of ovary, right 11/29/2015  . History of PCOS                               Surgical Hx:    Past Surgical History:  Procedure Laterality Date  . INCISION AND DRAINAGE     right outer leg   Medications: Reviewed & Updated - see associated section                       Current Outpatient Prescriptions:  .  COLLAGEN PO, Take by mouth. Takes 3 in the am, Disp: , Rfl:  .  prenatal vitamin w/FE, FA (PRENATAL 1 + 1) 27-1 MG TABS tablet, Take 1 tablet by mouth daily at 12 noon., Disp: 30 each, Rfl: 12 .  HYDROcodone-acetaminophen (NORCO/VICODIN) 5-325 MG tablet, Take 1 tablet by mouth every 6 (six) hours as needed. (Patient not taking: Reported on 03/12/2016), Disp: 30 tablet, Rfl: 0   Social History: Reviewed -  reports that she has never smoked. She has never used smokeless tobacco.  Objective Findings:  Vitals: Blood pressure 130/70, pulse 88, weight 191 lb (86.6 kg), last menstrual period 01/13/2016, unknown if currently breastfeeding.  Physical Examination: General appearance - alert, well appearing, and in no distress Mental status - alert, oriented to person, place, and  time Extremities - peripheral pulses normal, no pedal edema, no clubbing or cyanosis. Small dressing 2x4 completely covers wound. Dramatic improvement without any surrounding erythema.    Assessment & Plan:   A:  1. Dramatic improvement of healing drained abscess   2. Completed SAB  P:  1. Return to work now  2. F/u PRN    By signing my name below, I, Doreatha MartinEva Mathews, attest that this documentation has been prepared under the direction and in the presence of Tilda BurrowJohn Ronique Simerly V, MD. Electronically Signed: Doreatha MartinEva Mathews, ED Scribe. 03/12/16. 2:46 PM.  I personally performed the services described in this documentation, which was SCRIBED in my presence. The recorded information has been reviewed and considered accurate. It has been edited as necessary during review. Tilda BurrowFERGUSON,Shreyas Piatkowski V, MD

## 2016-03-15 ENCOUNTER — Ambulatory Visit (HOSPITAL_COMMUNITY): Payer: Self-pay | Admitting: Physical Therapy

## 2016-03-15 DIAGNOSIS — L02415 Cutaneous abscess of right lower limb: Secondary | ICD-10-CM

## 2016-03-15 DIAGNOSIS — R262 Difficulty in walking, not elsewhere classified: Secondary | ICD-10-CM

## 2016-03-15 NOTE — Therapy (Signed)
[facility] [place], [place], [address] Phone: [phone]   Fax:  [phone]  Wound Care Therapy  Patient Details  Name: Eileen [name] MRN: [id number] Date of Birth: 10/03/1980 Referring Provider: Mallory Shirk  Encounter Date: 03/15/2016      PT End of Session - 03/15/16 0950    Visit Number 5   Number of Visits 8   Date for PT Re-Evaluation 03/31/16   Authorization Type no insurance   Authorization - Visit Number 5   Authorization - Number of Visits 8   PT Start Time 0815   PT Stop Time 0830   PT Time Calculation (min) 15 min   Activity Tolerance Patient tolerated treatment well   Behavior During Therapy [facility] for tasks assessed/performed      Past Medical History:  Diagnosis Date  . Abscess 02/29/2016  . Cyst of ovary, right 11/29/2015  . History of PCOS     Past Surgical History:  Procedure Laterality Date  . INCISION AND DRAINAGE     right outer leg    There were no vitals filed for this visit.                  Wound Therapy - 03/15/16 0947    Subjective comes with interpreter Zenda Alpers) and states she returned to MD and he was very pleased.  No pain or issues   Patient and Family Stated Goals wound to heal    Date of Onset 02/22/16   Prior Treatments I&D in ER and MD office    Pain Assessment No/denies pain   Evaluation and Treatment Procedures Explained to Patient/Family Yes   Evaluation and Treatment Procedures agreed to   Wound Therapy - Clinical Statement Much improved with no debridement needed.  Irrigated wound and cleansed.  continued with samll 2X2 with hydrogel and covered with small piece of ABD and tape.  2X week is now appropriate at this stage (she is already scheduled this way).  Very little depth remains in middle and top wounds.   Wound Therapy - Functional Problem List pain causing difficulty in walking.    Factors Delaying/Impairing Wound Healing Infection  - systemic/local   Hydrotherapy Plan Debridement;Dressing change;Patient/family education;Pulsatile lavage with suction   Wound Therapy - Frequency Other (comment)  2x/week    Wound Therapy - Current Recommendations PT   Wound Plan Pt to be seen twice a week for irrigation, debridement and appropriate dressing.   Dressing  hydrogel moistened gauze placed over wound beds, ABD pad, medipore                    PT Short Term Goals - 03/09/16 0950      PT SHORT TERM GOAL #1   Title Pt to be able to verbalize signs of infection and verbalize that she must contact her MD immediately.    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    Time 4   Period Weeks   Status Achieved             Patient will benefit from skilled therapeutic intervention in order to improve the following deficits and impairments:     Visit Diagnosis: Difficulty in walking, not elsewhere classified  Abscess of right thigh     Problem List Patient Active Problem List   Diagnosis Date Noted  . Spontaneous abortion in first trimester 03/12/2016  . Abscess of right thigh 03/01/2016  . Abscess 02/29/2016  . Cyst of ovary, right 11/29/2015    Teena Irani, PTA/CLT [phone]  03/15/2016, 9:51 AM  [facility] [address] [place], [place], [address] Phone: [phone]   Fax:  [phone]  Name: Shari Natt MRN: [id number] Date of Birth: 07/27/1980

## 2016-03-19 ENCOUNTER — Ambulatory Visit (HOSPITAL_COMMUNITY): Payer: Self-pay | Admitting: Physical Therapy

## 2016-03-19 ENCOUNTER — Telehealth (HOSPITAL_COMMUNITY): Payer: Self-pay | Admitting: Physical Therapy

## 2016-03-19 DIAGNOSIS — L02415 Cutaneous abscess of right lower limb: Secondary | ICD-10-CM

## 2016-03-19 DIAGNOSIS — R262 Difficulty in walking, not elsewhere classified: Secondary | ICD-10-CM

## 2016-03-19 NOTE — Telephone Encounter (Signed)
Patient was discharged from rehab °

## 2016-03-19 NOTE — Therapy (Signed)
[place], [place], [address] Phone: [phone]   Fax:  [phone]  Wound Care Therapy  Patient Details  Name: Eileen [name] MRN: [id number] Date of Birth: 15-Jul-1980 Referring Provider: Mallory Shirk  Encounter Date: 03/19/2016      PT End of Session - 03/19/16 1133    Visit Number 6   Number of Visits 6   Date for PT Re-Evaluation 03/31/16   Authorization Type no insurance   Authorization - Visit Number 6   Authorization - Number of Visits 6   PT Start Time 0900   PT Stop Time 0915   PT Time Calculation (min) 15 min   Activity Tolerance Patient tolerated treatment well   Behavior During Therapy [facility] for tasks assessed/performed      Past Medical History:  Diagnosis Date  . Abscess 02/29/2016  . Cyst of ovary, right 11/29/2015  . History of PCOS     Past Surgical History:  Procedure Laterality Date  . INCISION AND DRAINAGE     right outer leg    There were no vitals filed for this visit.                  Wound Therapy - 03/19/16 1128    Subjective comes with interpreter (Robo)    No pain or issues   Patient and Family Stated Goals wound to heal    Date of Onset 02/22/16   Prior Treatments I&D in ER and MD office    Pain Assessment No/denies pain   Evaluation and Treatment Procedures Explained to Patient/Family Yes   Evaluation and Treatment Procedures agreed to   Wound Therapy - Clinical Statement Pt wounds no longer need skilled care.  Pt agrees that she feels confident in self care of wounds .  At this time there are three wounds that are 100% granulated with no depth.  The most superior is 1.7x .5; second is .3cm x ..5 cm and the third is .1 cm x .3 cm.  Pt explained to cleanse in shower followed by patting dry and placing a bandaid on the wound sites.    Wound Therapy - Functional Problem List none   Hydrotherapy Plan Patient/family education  for discharge.    Wound  Therapy - Frequency Other (comment)  2x/week    Wound Therapy - Current Recommendations PT   Wound Plan Pt to be discharged from skilled therapy at this time.                  PT Education - 03/19/16 1133    Education provided Yes   Education Details cleanse with dial soap and place bandaid on area.    Person(s) Educated Patient   Methods Explanation   Comprehension Verbalized understanding          PT Short Term Goals - 03/19/16 1134      PT SHORT TERM GOAL #1   Title Pt to be able to verbalize signs of infection and verbalize that she must contact her MD immediately.    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    Time 4   Period Weeks   Status Achieved               Plan - 03/19/16 1134    Clinical Impression Statement Pt is ready to be discharged to self care.    PT Next Visit Plan Discharge.      Patient will benefit from skilled therapeutic intervention in order to improve the following deficits and impairments:     Visit Diagnosis: Difficulty in walking, not elsewhere classified  Abscess of right thigh     Problem List Patient Active Problem List   Diagnosis Date Noted  . Spontaneous abortion in first trimester 03/12/2016  . Abscess of right thigh 03/01/2016  . Abscess 02/29/2016  . Cyst of ovary, right 11/29/2015   Rayetta Humphrey, PT CLT [phone] 03/19/2016, 11:35 AM  [facility] [address] [place], [place], [address] Phone: [phone]   Fax:   [phone]  Name: Katieann Hungate MRN: [id number] Date of Birth: 1980/09/02  PHYSICAL THERAPY DISCHARGE SUMMARY  Visits from Start of Care: 6  Current functional level related to goals / functional outcomes: As above   Remaining deficits: Small openings   Education / Equipment: Self care  Plan: Patient agrees to discharge.  Patient goals were partially met. Patient is being discharged due to being pleased with the current functional level.  ?????       Rayetta Humphrey, [place] CLT [phone]

## 2016-03-20 ENCOUNTER — Ambulatory Visit (HOSPITAL_COMMUNITY): Payer: Self-pay | Admitting: Physical Therapy

## 2016-03-22 ENCOUNTER — Ambulatory Visit (HOSPITAL_COMMUNITY): Payer: Self-pay | Admitting: Physical Therapy

## 2016-03-26 ENCOUNTER — Ambulatory Visit (HOSPITAL_COMMUNITY): Payer: Self-pay | Admitting: Physical Therapy

## 2016-03-29 ENCOUNTER — Ambulatory Visit (HOSPITAL_COMMUNITY): Payer: Self-pay | Admitting: Physical Therapy

## 2016-04-02 ENCOUNTER — Ambulatory Visit (HOSPITAL_COMMUNITY): Payer: Self-pay | Admitting: Physical Therapy

## 2016-04-05 ENCOUNTER — Ambulatory Visit (HOSPITAL_COMMUNITY): Payer: Self-pay | Admitting: Physical Therapy

## 2016-04-09 ENCOUNTER — Encounter: Payer: Self-pay | Admitting: Obstetrics and Gynecology

## 2016-04-09 ENCOUNTER — Ambulatory Visit (INDEPENDENT_AMBULATORY_CARE_PROVIDER_SITE_OTHER): Payer: Medicaid Other | Admitting: Obstetrics and Gynecology

## 2016-04-09 VITALS — BP 118/72 | HR 75 | Wt 193.6 lb

## 2016-04-09 DIAGNOSIS — L02415 Cutaneous abscess of right lower limb: Secondary | ICD-10-CM

## 2016-04-09 NOTE — Progress Notes (Signed)
Patient ID: Eileen [name], female   DOB: 01/13/1981, 36 y.o.   MRN: [id number]  Language interpreter used , in room with encounter.   [facility] Visit  @DATE @            Patient name: Eileen [name] MRN [id number]  Date of birth: 01/13/1981  CC & HPI:   Chief Complaint  Patient presents with   Follow-up    abscess; vomiting/abd pain     Eileen [name] is a 36 y.o. female presenting today for wound check of abscess to right upper thigh that was drained ~6 weeks ago.   She does complain of intermittent RUQ abdominal pain that began 4 days ago with associated N/V. Pt states she has not associated her pain with food intake.   She also states she would like to conceive.   ROS:  ROS +RUQ pain, N/V  Pertinent History Reviewed:   Reviewed Medical         Past Medical History:  Diagnosis Date   Abscess 02/29/2016   Cyst of ovary, right 11/29/2015   History of PCOS                               Surgical Hx:    Past Surgical History:  Procedure Laterality Date   INCISION AND DRAINAGE     right outer leg   Medications: Reviewed & Updated - see associated section                       Current Outpatient Prescriptions:    COLLAGEN PO, Take by mouth. Takes 3 in the am, Disp: , Rfl:    prenatal vitamin w/FE, FA (PRENATAL 1 + 1) 27-1 MG TABS tablet, Take 1 tablet by mouth daily at 12 noon., Disp: 30 each, Rfl: 12   HYDROcodone-acetaminophen (NORCO/VICODIN) 5-325 MG tablet, Take 1 tablet by mouth every 6 (six) hours as needed. (Patient not taking: Reported on 03/12/2016), Disp: 30 tablet, Rfl: 0   Social History: Reviewed -  reports that she has never smoked. She has never used smokeless tobacco.  Objective Findings:  Vitals: Blood pressure 118/72, pulse 75, weight 193 lb 9.6 oz (87.8 kg), last menstrual period 03/04/2016, unknown if currently breastfeeding.  Physical Examination: General appearance - alert, well appearing, and in no  distress Mental status - alert, oriented to person, place, and time Abdomen- soft, non-tender, no guarding, rebound or masses  Extremities - well healed scar s/p healing of the abscess, 1 mm superficial area that is incompletely healed    Assessment & Plan:   A:  1. Well healed drained abscess  2. R/o gallstones with US if pain persists  3. Pt would like to conceive may return prn for discussion  P:  1. Pt to refrain from eating greasy foods  2. Recommended [email or web address] for fertility information  3. Return in 3 months if needed     By signing my name below, I, Doreatha Martinva Mathews, attest that this documentation has been prepared under the direction and in the presence of Tilda BurrowJohn Ferguson V, MD. Electronically Signed: Doreatha MartinEva Mathews, ED Scribe. 04/09/16. 11:05 AM.  I personally performed the services described in this documentation, which was SCRIBED in my presence. The recorded information has been reviewed and considered accurate. It has been edited as necessary during review. Tilda BurrowFERGUSON,JOHN V, MD

## 2016-04-09 NOTE — Patient Instructions (Signed)
[email or web address] 

## 2016-04-10 ENCOUNTER — Telehealth: Payer: Self-pay | Admitting: Adult Health

## 2016-04-10 DIAGNOSIS — R1011 Right upper quadrant pain: Secondary | ICD-10-CM

## 2016-04-10 NOTE — Telephone Encounter (Signed)
Pt came into the office sating that Dr. Emelda FearFerguson was going to speak with Victorino DikeJennifer regarding her getting an Ultrasound. Pt states that Dr. Emelda FearFerguson stated that she had stones in gallbladder, but the ultrasound will help determine it. Please contact pt

## 2016-04-11 NOTE — Telephone Encounter (Signed)
Abdom[facility]  [facility] after midnight arrive 15 minutes early.will get Daisy to call and let her know.Dr Emelda FearFerguson wanted me to schedule.

## 2016-04-16 ENCOUNTER — Ambulatory Visit (HOSPITAL_COMMUNITY): Admission: RE | Admit: 2016-04-16 | Payer: Self-pay | Source: Ambulatory Visit

## 2016-04-20 ENCOUNTER — Telehealth: Payer: Self-pay | Admitting: *Deleted

## 2016-04-20 NOTE — Telephone Encounter (Signed)
Called patient to let her know that I spoke with someone at [facility] and she just needs to call and schedule appointment. They said the order is in. Number given and patient will call.

## 2016-04-25 ENCOUNTER — Telehealth: Payer: Self-pay | Admitting: *Deleted

## 2016-04-25 DIAGNOSIS — R1011 Right upper quadrant pain: Secondary | ICD-10-CM

## 2016-04-25 NOTE — Telephone Encounter (Signed)
This patient is trying to reschedule her abdominal U/S but they are stating an order needs to be placed. She has an order in but since she canceled her last U/S appointment, I don't know if it was canceled out. Please re-enter if necessary so patient can schedule.

## 2016-05-01 ENCOUNTER — Emergency Department (HOSPITAL_COMMUNITY): Payer: Self-pay

## 2016-05-01 ENCOUNTER — Inpatient Hospital Stay (HOSPITAL_COMMUNITY)
Admission: EM | Admit: 2016-05-01 | Discharge: 2016-05-03 | DRG: 419 | Disposition: A | Discharge status: Home / Self Care | Payer: Self-pay | Attending: General Surgery | Admitting: General Surgery

## 2016-05-01 ENCOUNTER — Encounter (HOSPITAL_COMMUNITY): Payer: Self-pay | Admitting: *Deleted

## 2016-05-01 DIAGNOSIS — K8012 Calculus of gallbladder with acute and chronic cholecystitis without obstruction: Principal | ICD-10-CM | POA: Diagnosis present

## 2016-05-01 DIAGNOSIS — K810 Acute cholecystitis: Secondary | ICD-10-CM | POA: Diagnosis present

## 2016-05-01 DIAGNOSIS — E282 Polycystic ovarian syndrome: Secondary | ICD-10-CM | POA: Diagnosis present

## 2016-05-01 DIAGNOSIS — Z833 Family history of diabetes mellitus: Secondary | ICD-10-CM

## 2016-05-01 DIAGNOSIS — K819 Cholecystitis, unspecified: Secondary | ICD-10-CM

## 2016-05-01 LAB — CBC
HCT: 40.3 % (ref 36.0–46.0)
HEMOGLOBIN: 14 g/dL (ref 12.0–15.0)
MCH: 30 pg (ref 26.0–34.0)
MCHC: 34.7 g/dL (ref 30.0–36.0)
MCV: 86.5 fL (ref 78.0–100.0)
Platelets: 288 10*3/uL (ref 150–400)
RBC: 4.66 MIL/uL (ref 3.87–5.11)
RDW: 13 % (ref 11.5–15.5)
WBC: 8.8 10*3/uL (ref 4.0–10.5)

## 2016-05-01 LAB — URINALYSIS, ROUTINE W REFLEX MICROSCOPIC
BILIRUBIN URINE: NEGATIVE
Glucose, UA: NEGATIVE mg/dL
Hgb urine dipstick: NEGATIVE
KETONES UR: NEGATIVE mg/dL
Leukocytes, UA: NEGATIVE
NITRITE: NEGATIVE
PROTEIN: NEGATIVE mg/dL
SPECIFIC GRAVITY, URINE: 1.027 (ref 1.005–1.030)
pH: 5 (ref 5.0–8.0)

## 2016-05-01 LAB — COMPREHENSIVE METABOLIC PANEL
ALK PHOS: 52 U/L (ref 38–126)
ALT: 21 U/L (ref 14–54)
ANION GAP: 7 (ref 5–15)
AST: 18 U/L (ref 15–41)
Albumin: 4.2 g/dL (ref 3.5–5.0)
BILIRUBIN TOTAL: 0.3 mg/dL (ref 0.3–1.2)
BUN: 11 mg/dL (ref 6–20)
CALCIUM: 9.3 mg/dL (ref 8.9–10.3)
CO2: 29 mmol/L (ref 22–32)
Chloride: 100 mmol/L — ABNORMAL LOW (ref 101–111)
Creatinine, Ser: 0.6 mg/dL (ref 0.44–1.00)
GFR calc non Af Amer: >60 mL/min (ref >60)
GLUCOSE: 109 mg/dL — AB (ref 65–99)
Potassium: 3.5 mmol/L (ref 3.5–5.1)
Sodium: 136 mmol/L (ref 135–145)
TOTAL PROTEIN: 7.1 g/dL (ref 6.5–8.1)

## 2016-05-01 LAB — PREGNANCY, URINE: PREG TEST UR: NEGATIVE

## 2016-05-01 LAB — LIPASE, BLOOD: Lipase: 21 U/L (ref 11–51)

## 2016-05-01 MED ORDER — IOPAMIDOL (ISOVUE-300) INJECTION 61%
INTRAVENOUS | Status: AC
  Administered 2016-05-01: 30 mL
  Filled 2016-05-01: qty 30

## 2016-05-01 MED ORDER — FENTANYL CITRATE (PF) 100 MCG/2ML IJ SOLN
50.0000 ug | Freq: Once | INTRAMUSCULAR | Status: AC
  Administered 2016-05-01: 50 ug via INTRAVENOUS
  Filled 2016-05-01: qty 2

## 2016-05-01 MED ORDER — ACETAMINOPHEN 650 MG RE SUPP: 650.0000 mg | Freq: Four times a day (QID) | RECTAL | Status: DC | PRN

## 2016-05-01 MED ORDER — ONDANSETRON HCL 4 MG/2ML IJ SOLN
4.0000 mg | Freq: Once | INTRAMUSCULAR | Status: AC
  Administered 2016-05-01: 4 mg via INTRAVENOUS
  Filled 2016-05-01: qty 2

## 2016-05-01 MED ORDER — ENOXAPARIN SODIUM 40 MG/0.4ML ~~LOC~~ SOLN
40.0000 mg | SUBCUTANEOUS | Status: DC
  Administered 2016-05-01: 40 mg via SUBCUTANEOUS
  Filled 2016-05-01: qty 0.4

## 2016-05-01 MED ORDER — MORPHINE SULFATE (PF) 2 MG/ML IV SOLN
INTRAVENOUS | Status: AC
  Administered 2016-05-01: 4 mg via INTRAVENOUS
  Filled 2016-05-01: qty 2

## 2016-05-01 MED ORDER — DEXTROSE 5 % IV SOLN
2.0000 g | Freq: Once | INTRAVENOUS | Status: AC
  Administered 2016-05-01: 2 g via INTRAVENOUS
  Filled 2016-05-01: qty 2

## 2016-05-01 MED ORDER — IOPAMIDOL (ISOVUE-300) INJECTION 61%
100.0000 mL | Freq: Once | INTRAVENOUS | Status: AC | PRN
  Administered 2016-05-01: 100 mL via INTRAVENOUS

## 2016-05-01 MED ORDER — MORPHINE SULFATE (PF) 2 MG/ML IV SOLN
2.0000 mg | INTRAVENOUS | Status: DC | PRN
  Administered 2016-05-01: 2 mg via INTRAVENOUS
  Filled 2016-05-01: qty 1

## 2016-05-01 MED ORDER — DEXTROSE 5 % IV SOLN
2.0000 g | INTRAVENOUS | Status: DC
  Administered 2016-05-02: 1 g via INTRAVENOUS
  Administered 2016-05-02: 2 g via INTRAVENOUS
  Filled 2016-05-01 (×3): qty 2

## 2016-05-01 MED ORDER — SODIUM CHLORIDE 0.9 % IV SOLN
INTRAVENOUS | Status: DC
  Administered 2016-05-01 – 2016-05-02 (×3): via INTRAVENOUS

## 2016-05-01 MED ORDER — SODIUM CHLORIDE 0.9 % IV BOLUS (SEPSIS)
1000.0000 mL | Freq: Once | INTRAVENOUS | Status: AC
  Administered 2016-05-01: 1000 mL via INTRAVENOUS

## 2016-05-01 MED ORDER — SODIUM CHLORIDE 0.9 % IV BOLUS (SEPSIS)
500.0000 mL | Freq: Once | INTRAVENOUS | Status: AC
  Administered 2016-05-01: 500 mL via INTRAVENOUS

## 2016-05-01 MED ORDER — ACETAMINOPHEN 325 MG PO TABS: 650.0000 mg | ORAL_TABLET | Freq: Four times a day (QID) | ORAL | Status: DC | PRN

## 2016-05-01 MED ORDER — ONDANSETRON HCL 4 MG/2ML IJ SOLN: 4.0000 mg | Freq: Four times a day (QID) | INTRAMUSCULAR | Status: DC | PRN

## 2016-05-01 MED ORDER — MORPHINE SULFATE (PF) 4 MG/ML IV SOLN: 4.0000 mg | Freq: Once | INTRAVENOUS | Status: DC

## 2016-05-01 MED ORDER — ONDANSETRON HCL 4 MG PO TABS: 4.0000 mg | ORAL_TABLET | Freq: Four times a day (QID) | ORAL | Status: DC | PRN

## 2016-05-01 NOTE — Progress Notes (Signed)
Patient is in the hospital earlier this morning by Dr. Sharl MaLama.  Patient seen and examined. She presented with right upper quadrant abdominal pain which has improved with pain medications. Imaging has indicated cholelithiasis with thickened gallbladder consistent with cholecystitis. LFTs have been unremarkable. Patient does still have some mild tenderness to palpation in the right upper quadrant. Overall, she was feeling better and initially wanted to return home from the emergency room and pursue cholecystectomy as an outpatient. After eating breakfast, she reported recurrence of nausea and abdominal pain. She has agreed to stay in the hospital for cholecystectomy. She's been seen by general surgery and plans are for surgery in a.m . will keep on clear liquids for now. Anticipate discharge home after surgery.  Eileen [name]

## 2016-05-01 NOTE — H&P (Signed)
TRH H&P    Patient Demographics:    Eileen [name], is a 36 y.o. female  MRN: [id number]  DOB - Apr 03, 1980  Admit Date - 05/01/2016  Referring MD/NP/PA: Dr. Lynelle Doctor  Outpatient Primary MD for the patient is Cyril Mourning, NP  Patient coming from: Home  Chief Complaint  Patient presents with  . Abdominal Pain      HPI:    Eileen [name]  is a 36 year old hispanic female who came to hospital after she developed right upper quadrant pain around 10 PM. Patient says that pain started after she ate her dinner of fried eggs. She had 4 episodes of nausea and vomiting last night. Pain is 10/10 in intensity. Did not have similar episodes of pain in the past. Denies fever. No chest pain or shortness of breath. No diarrhea.  CT of the abdomen pelvis was done, which showed cholelithiasis with gallbladder wall thickening and pericholecystic edema suggesting acute cholecystitis. General surgery was consulted by ED physician, Dr. Earlene Plater recommend admit to medicine and he will see patient in consultation.    Review of systems:    In addition to the HPI above,  No Fever-chills, No Headache, No changes with Vision or hearing, No problems swallowing food or Liquids, No Chest pain, Cough or Shortness of Breath, No Blood in stool or Urine, No dysuria, No new skin rashes or bruises, No new joints pains-aches,  No new weakness, tingling, numbness in any extremity, No recent weight gain or loss, No polyuria, polydypsia or polyphagia, No significant Mental Stressors.  A full 10 point Review of Systems was done, except as stated above, all other Review of Systems were negative.   With Past History of the following :    Past Medical History:  Diagnosis Date  . Abscess 02/29/2016  . Cyst of ovary, right 11/29/2015  . History of PCOS       Past Surgical History:  Procedure Laterality Date  .  INCISION AND DRAINAGE     right outer leg      Social History:      Social History  Substance Use Topics  . Smoking status: Never Smoker  . Smokeless tobacco: Never Used  . Alcohol use No     Comment: sometimes; not now       Family History :     Family History  Problem Relation Age of Onset  . Arthritis/Rheumatoid Father   . Diabetes Mother   . Diabetes Sister       Home Medications:   Prior to Admission medications   Medication Sig Start Date End Date Taking? Authorizing Provider  COLLAGEN PO Take by mouth. Takes 3 in the am    Historical Provider, MD  HYDROcodone-acetaminophen (NORCO/VICODIN) 5-325 MG tablet Take 1 tablet by mouth every 6 (six) hours as needed. Patient not taking: Reported on 03/12/2016 02/29/16   Tilda Burrow, MD  prenatal vitamin w/FE, FA (PRENATAL 1 + 1) 27-1 MG TABS tablet Take 1 tablet by mouth daily at 12 noon. 02/21/16   Adline Potter, NP  Allergies:    No Known Allergies   Physical Exam:   Vitals  Blood pressure 121/73, pulse 67, temperature 97.9 F (36.6 C), temperature source Oral, resp. rate 10, height 5\' 1"  (1.549 m), weight 87.5 kg (193 lb), last menstrual period 04/20/2016, SpO2 98 %, unknown if currently breastfeeding.  1.  General: Appears in no acute distress  2. Psychiatric:  Intact judgement and  insight, awake alert, oriented x 3.  3. Neurologic: No focal neurological deficits, all cranial nerves intact.Strength 5/5 all 4 extremities, sensation intact all 4 extremities, plantars down going.  4. Eyes :  anicteric sclerae, moist conjunctivae with no lid lag. PERRLA.  5. ENMT:  Oropharynx clear with moist mucous membranes and good dentition  6. Neck:  supple, no cervical lymphadenopathy appriciated, No thyromegaly  7. Respiratory : Normal respiratory effort, good air movement bilaterally,clear to  auscultation bilaterally  8. Cardiovascular : RRR, no gallops, rubs or murmurs, no leg edema  9.  Gastrointestinal:  Positive bowel sounds, abdomen soft, tenderness to palpation in right upper quadrant ,no hepatosplenomegaly, no rigidity or guarding       10. Skin:  No cyanosis, normal texture and turgor, no rash, lesions or ulcers  11.Musculoskeletal:  Good muscle tone,  joints appear normal , no effusions,  normal range of motion    Data Review:    CBC  Recent Labs Lab 05/01/16 0151  WBC 8.8  HGB 14.0  HCT 40.3  PLT 288  MCV 86.5  MCH 30.0  MCHC 34.7  RDW 13.0   ------------------------------------------------------------------------------------------------------------------  Chemistries   Recent Labs Lab 05/01/16 0151  NA 136  K 3.5  CL 100*  CO2 29  GLUCOSE 109*  BUN 11  CREATININE 0.60  CALCIUM 9.3  AST 18  ALT 21  ALKPHOS 52  BILITOT 0.3   ------------------------------------------------------------------------------------------------------------------  ------------------------------------------------------------------------------------------------------------------ GFR: Estimated Creatinine Clearance: 98.7 mL/min (by C-G formula based on SCr of 0.6 mg/dL). Liver Function Tests:  Recent Labs Lab 05/01/16 0151  AST 18  ALT 21  ALKPHOS 52  BILITOT 0.3  PROT 7.1  ALBUMIN 4.2    Recent Labs Lab 05/01/16 0151  LIPASE 21    --------------------------------------------------------------------------------------------------------------- Urine analysis:    Component Value Date/Time   COLORURINE YELLOW 05/01/2016 0330   APPEARANCEUR CLOUDY (A) 05/01/2016 0330   LABSPEC 1.027 05/01/2016 0330   PHURINE 5.0 05/01/2016 0330   GLUCOSEU NEGATIVE 05/01/2016 0330   HGBUR NEGATIVE 05/01/2016 0330   BILIRUBINUR NEGATIVE 05/01/2016 0330   KETONESUR NEGATIVE 05/01/2016 0330   PROTEINUR NEGATIVE 05/01/2016 0330   NITRITE NEGATIVE 05/01/2016 0330   LEUKOCYTESUR NEGATIVE 05/01/2016 0330      Imaging Results:    Ct Abdomen Pelvis W  Contrast  Result Date: 05/01/2016 CLINICAL DATA:  Upper abdominal pain for 10 days, worsening. Vomiting. Right upper quadrant pain tonight. EXAM: CT ABDOMEN AND PELVIS WITH CONTRAST TECHNIQUE: Multidetector CT imaging of the abdomen and pelvis was performed using the standard protocol following bolus administration of intravenous contrast. CONTRAST:  30mL ISOVUE-300 IOPAMIDOL (ISOVUE-300) INJECTION 61%, 100mL ISOVUE-300 IOPAMIDOL (ISOVUE-300) INJECTION 61% COMPARISON:  05/26/2004 FINDINGS: Lower chest: Lung bases are clear. Hepatobiliary: Cholelithiasis with mild gallbladder wall thickening and pericholecystic edema. Changes could indicate cholecystitis in the appropriate clinical setting. No bile duct dilatation. No focal liver lesions. Pancreas: Unremarkable. No pancreatic ductal dilatation or surrounding inflammatory changes. Spleen: Normal in size without focal abnormality. Adrenals/Urinary Tract: Adrenal glands are unremarkable. Kidneys are normal, without renal calculi, focal lesion, or hydronephrosis. Bladder is unremarkable.  Stomach/Bowel: Stomach is within normal limits. Appendix appears normal. No evidence of bowel wall thickening, distention, or inflammatory changes. Vascular/Lymphatic: No significant vascular findings are present. No enlarged abdominal or pelvic lymph nodes. Reproductive: Cystic changes in the right ovary measuring up to about 3 cm diameter. This is likely functional. Uterus and left ovary are unremarkable. Other: No abdominal wall hernia or abnormality. No abdominopelvic ascites. Musculoskeletal: No acute or significant osseous findings. IMPRESSION: Cholelithiasis with gallbladder wall thickening and pericholecystic edema suggesting acute cholecystitis in the appropriate clinical setting. Right ovarian cyst is likely functional. Electronically Signed   By: Burman Nieves M.D.   On: 05/01/2016 05:11    My personal review of EKG: Rhythm NSR   Assessment & Plan:    Active  Problems:   Cholecystitis   1. Acute cholecystitis- patient presenting with acute cholecystitis, will keep her nothing by mouth. Continue ceftriaxone per pharmacy consultation. General surgery was consulted by ED physician. Dr. Earlene Plater to see the patient today. We'll start morphine 2 mg IV every 4 hours when necessary for pain.    DVT Prophylaxis-   Lovenox   AM Labs Ordered, also please review Full Orders  Family Communication: Admission, patients condition and plan of care including tests being ordered have been discussed with the patient and  who indicate understanding and agree with the plan and Code Status.  Code Status:  Full code  Admission status: Observation    Time spent in minutes : 60 minutes   Hisayo Delossantos S M.D on 05/01/2016 at 6:25 AM  Between 7am to 7pm - Pager - [phone]. After 7pm go to [email or web address] - password [facility]  [facility] - Office  [phone]

## 2016-05-01 NOTE — ED Provider Notes (Signed)
[facility]-EMERGENCY DEPT Provider Note   CSN: [id number] Arrival date & time: 05/01/16  0129  Time seen 02:10 AM  History   Chief Complaint Chief Complaint  Patient presents with  . Abdominal Pain    HPI Eileen [name] is a 36 y.o. female.  HPI  patient states she ate fried eggs into a tedious for dinner tonight. About 10 PM she started having acute onset of right upper quadrant pain. She states the pain radiates into her back. She has had 4 episodes of nausea and vomiting tonight. She denies diarrhea or fever. She states she started having similar pains last month and has had several episodes. Tonight is the worst. She states she thinks her sister has gallstones. Patient is G0 P0.  PCP Cyril Mourning, NP   Past Medical History:  Diagnosis Date  . Abscess 02/29/2016  . Cyst of ovary, right 11/29/2015  . History of PCOS     Patient Active Problem List   Diagnosis Date Noted  . Spontaneous abortion in first trimester 03/12/2016  . Abscess 02/29/2016  . Cyst of ovary, right 11/29/2015    Past Surgical History:  Procedure Laterality Date  . INCISION AND DRAINAGE     right outer leg    OB History    Gravida Para Term Preterm AB Living   1 0 0 0 0 0   SAB TAB Ectopic Multiple Live Births   0 0 0 0 0       Home Medications    Prior to Admission medications   Medication Sig Start Date End Date Taking? Authorizing Provider  COLLAGEN PO Take by mouth. Takes 3 in the am    Historical Provider, MD  HYDROcodone-acetaminophen (NORCO/VICODIN) 5-325 MG tablet Take 1 tablet by mouth every 6 (six) hours as needed. Patient not taking: Reported on 03/12/2016 02/29/16   Tilda Burrow, MD  prenatal vitamin w/FE, FA (PRENATAL 1 + 1) 27-1 MG TABS tablet Take 1 tablet by mouth daily at 12 noon. 02/21/16   Adline Potter, NP    Family History Family History  Problem Relation Age of Onset  . Arthritis/Rheumatoid Father   . Diabetes Mother   . Diabetes Sister     Social  History Social History  Substance Use Topics  . Smoking status: Never Smoker  . Smokeless tobacco: Never Used  . Alcohol use No     Comment: sometimes; not now  employed at the [employer]   Allergies   Patient has no known allergies.   Review of Systems Review of Systems  All other systems reviewed and are negative.    Physical Exam Updated Vital Signs BP 130/75 (BP Location: Right Arm)   Pulse (!) 59   Temp 97.9 F (36.6 C) (Oral)   Resp 17   Ht 5\' 1"  (1.549 m)   Wt 193 lb (87.5 kg)   LMP 04/20/2016 (Exact Date)   SpO2 100%   BMI 36.47 kg/m   Vital signs normal except for bradycardia   Physical Exam  Constitutional: She is oriented to person, place, and time. She appears well-developed and well-nourished.  Non-toxic appearance. She does not appear ill. She appears distressed.  HENT:  Head: Normocephalic and atraumatic.  Right Ear: External ear normal.  Left Ear: External ear normal.  Nose: Nose normal. No mucosal edema or rhinorrhea.  Mouth/Throat: Oropharynx is clear and moist and mucous membranes are normal. No dental abscesses or uvula swelling.  Eyes: Conjunctivae and EOM are normal. Pupils  are equal, round, and reactive to light.  Neck: Normal range of motion and full passive range of motion without pain. Neck supple.  Cardiovascular: Normal rate, regular rhythm and normal heart sounds.  Exam reveals no gallop and no friction rub.   No murmur heard. Pulmonary/Chest: Effort normal and breath sounds normal. No respiratory distress. She has no wheezes. She has no rhonchi. She has no rales.     She exhibits no tenderness and no crepitus.  Abdominal: Soft. Normal appearance and bowel sounds are normal. She exhibits no distension. There is tenderness. There is no rebound and no guarding.    Very tender in the RUQ, also in the right posterior lower chest. Has some mild tenderness in the RLQ.   Musculoskeletal: Normal range of motion. She exhibits no  edema or tenderness.  Moves all extremities well.   Neurological: She is alert and oriented to person, place, and time. She has normal strength. No cranial nerve deficit.  Skin: Skin is warm, dry and intact. No rash noted. No erythema. No pallor.  Psychiatric: She has a normal mood and affect. Her speech is normal and behavior is normal. Her mood appears not anxious.  Nursing note and vitals reviewed.    ED Treatments / Results  Labs (all labs ordered are listed, but only abnormal results are displayed) Results for orders placed or performed during the hospital encounter of 05/01/16  Lipase, blood  Result Value Ref Range   Lipase 21 11 - 51 U/L  Comprehensive metabolic panel  Result Value Ref Range   Sodium 136 135 - 145 mmol/L   Potassium 3.5 3.5 - 5.1 mmol/L   Chloride 100 (L) 101 - 111 mmol/L   CO2 29 22 - 32 mmol/L   Glucose, Bld 109 (H) 65 - 99 mg/dL   BUN 11 6 - 20 mg/dL   Creatinine, Ser 4.090.60 0.44 - 1.00 mg/dL   Calcium 9.3 8.9 - 81.110.3 mg/dL   Total Protein 7.1 6.5 - 8.1 g/dL   Albumin 4.2 3.5 - 5.0 g/dL   AST 18 15 - 41 U/L   ALT 21 14 - 54 U/L   Alkaline Phosphatase 52 38 - 126 U/L   Total Bilirubin 0.3 0.3 - 1.2 mg/dL   GFR calc non Af Amer >60 >60 mL/min   GFR calc Af Amer >60 >60 mL/min   Anion gap 7 5 - 15  CBC  Result Value Ref Range   WBC 8.8 4.0 - 10.5 K/uL   RBC 4.66 3.87 - 5.11 MIL/uL   Hemoglobin 14.0 12.0 - 15.0 g/dL   HCT 91.440.3 78.236.0 - 95.646.0 %   MCV 86.5 78.0 - 100.0 fL   MCH 30.0 26.0 - 34.0 pg   MCHC 34.7 30.0 - 36.0 g/dL   RDW 21.313.0 08.611.5 - 57.815.5 %   Platelets 288 150 - 400 K/uL  Urinalysis, Routine w reflex microscopic  Result Value Ref Range   Color, Urine YELLOW YELLOW   APPearance CLOUDY (A) CLEAR   Specific Gravity, Urine 1.027 1.005 - 1.030   pH 5.0 5.0 - 8.0   Glucose, UA NEGATIVE NEGATIVE mg/dL   Hgb urine dipstick NEGATIVE NEGATIVE   Bilirubin Urine NEGATIVE NEGATIVE   Ketones, ur NEGATIVE NEGATIVE mg/dL   Protein, ur NEGATIVE NEGATIVE  mg/dL   Nitrite NEGATIVE NEGATIVE   Leukocytes, UA NEGATIVE NEGATIVE  Pregnancy, urine  Result Value Ref Range   Preg Test, Ur NEGATIVE NEGATIVE   Laboratory interpretation all normal  EKG  EKG Interpretation  Date/Time:  Tuesday May 01 2016 01:42:22 EDT Ventricular Rate:  63 PR Interval:    QRS Duration: 90 QT Interval:  414 QTC Calculation: 424 R Axis:   35 Text Interpretation:  Sinus rhythm Low voltage, precordial leads Otherwise within normal limits No old tracing to compare Confirmed by Ulmer Degen  MD-I, Kennedi Lizardo ([id number]) on 05/01/2016 1:48:16 AM       Radiology Ct Abdomen Pelvis W Contrast  Result Date: 05/01/2016 CLINICAL DATA:  Upper abdominal pain for 10 days, worsening. Vomiting. Right upper quadrant pain tonight. EXAM: CT ABDOMEN AND PELVIS WITH CONTRAST TECHNIQUE: Multidetector CT imaging of the abdomen and pelvis was performed using the standard protocol following bolus administration of intravenous contrast. CONTRAST:  30mL ISOVUE-300 IOPAMIDOL (ISOVUE-300) INJECTION 61%, ISOVUE-300 IOPAMIDOL (ISOVUE-300) INJECTION 61% COMPARISON:  05/26/2004 FINDINGS: Lower chest: Lung bases are clear. Hepatobiliary: Cholelithiasis with mild gallbladder wall thickening and pericholecystic edema. Changes could indicate cholecystitis in the appropriate clinical setting. No bile duct dilatation. No focal liver lesions. Pancreas: Unremarkable. No pancreatic ductal dilatation or surrounding inflammatory changes. Spleen: Normal in size without focal abnormality. Adrenals/Urinary Tract: Adrenal glands are unremarkable. Kidneys are normal, without renal calculi, focal lesion, or hydronephrosis. Bladder is unremarkable. Stomach/Bowel: Stomach is within normal limits. Appendix appears normal. No evidence of bowel wall thickening, distention, or inflammatory changes. Vascular/Lymphatic: No significant vascular findings are present. No enlarged abdominal or pelvic lymph nodes. Reproductive: Cystic  changes in the right ovary measuring up to about 3 cm diameter. This is likely functional. Uterus and left ovary are unremarkable. Other: No abdominal wall hernia or abnormality. No abdominopelvic ascites. Musculoskeletal: No acute or significant osseous findings. IMPRESSION: Cholelithiasis with gallbladder wall thickening and pericholecystic edema suggesting acute cholecystitis in the appropriate clinical setting. Right ovarian cyst is likely functional. Electronically Signed   By: Burman Nieves M.D.   On: 05/01/2016 05:11    Procedures Procedures (including critical care time)  Medications Ordered in ED Medications  cefTRIAXone (ROCEPHIN) 2 g in dextrose 5 % 50 mL IVPB (2 g Intravenous New Bag/Given 05/01/16 0530)  morphine 4 MG/ML injection 4 mg (not administered)  sodium chloride 0.9 % bolus 1,000 mL (1,000 mLs Intravenous New Bag/Given 05/01/16 0230)  sodium chloride 0.9 % bolus 500 mL (0 mLs Intravenous Stopped 05/01/16 0423)  fentaNYL (SUBLIMAZE) injection 50 mcg (50 mcg Intravenous Given 05/01/16 0231)  ondansetron (ZOFRAN) injection 4 mg (4 mg Intravenous Given 05/01/16 0231)  fentaNYL (SUBLIMAZE) injection 50 mcg (50 mcg Intravenous Given 05/01/16 0423)  iopamidol (ISOVUE-300) 61 % injection (30 mLs  Contrast Given 05/01/16 0455)  iopamidol (ISOVUE-300) 61 % injection 100 mL (100 mLs Intravenous Contrast Given 05/01/16 0455)     Initial Impression / Assessment and Plan / ED Course  I have reviewed the triage vital signs and the nursing notes.  Pertinent labs & imaging results that were available during my care of the patient were reviewed by me and considered in my medical decision making (see chart for details).   Patient was given IV fluids and IV pain and nausea medicine. We discussed we would get laboratory testing. My initial suspicion is she may have gallstones, however she could have renal stones or other intra-abdominal pathology such as colitis.  Recheck at 4 AM, we  discussed her laboratory test results which were normal. Patient continues to have pain. Her pain medication was repeated and CT scan was ordered. Ultrasound not available nighttime.  5:40 AM patient was given the results of  her CT scan. She states she still having pain. She was given IV morphine. We discussed admission for treatment of her acute cholecystitis and she is agreeable. She was given IV antibiotics.  05:54 AM Dr Lovell Sheehan, surgery, states to have hospitalist admit and he will consult  06:06 AM Dr Sharl Ma, will admit.   Final Clinical Impressions(s) / ED Diagnoses   Final diagnoses:  Acute cholecystitis    Plan admission  Devoria Albe, MD, Concha Pyo, MD 05/01/16 [phone]

## 2016-05-01 NOTE — ED Triage Notes (Signed)
C/o upper abdominal pain tonight and has had 3 episodes of vomiting.

## 2016-05-01 NOTE — ED Notes (Signed)
Pt assisted to bathroom and back to bed; urine specimen sent to lab ° °

## 2016-05-01 NOTE — Progress Notes (Signed)
Pharmacy Antibiotic Note  Eileen [name] is [name] 36 y.o. female admitted on 05/01/2016 with intra abdominal infection.  Pharmacy has been consulted for ROCEPHIN dosing.  Plan: Rocephin 2gm IV q24hrs Monitor labs, progress, c/s  Height: 5\' 1"  (154.9 cm) Weight: 193 lb (87.5 kg) IBW/kg (Calculated) : 47.8  Temp (24hrs), Avg:98.4 F (36.9 C), Min:97.9 F (36.6 C), Max:98.8 F (37.1 C)   Recent Labs Lab 05/01/16 0151  WBC 8.8  CREATININE 0.60    Estimated Creatinine Clearance: 98.7 mL/min (by C-G formula based on SCr of 0.6 mg/dL).    No Known Allergies  Antimicrobials this admission: Rocephin 3/13 >>   Dose adjustments this admission:  Microbiology results:  BCx:   UCx:   Sputum:    MRSA PCR:   Thank you for allowing pharmacy to be [name] part of this patient's care.  Eileen [name], Eileen [name] 05/01/2016 11:22 AM

## 2016-05-01 NOTE — ED Notes (Signed)
Pt was able to eat breakfast but is now feeling very nauseated. Not vomiting at this time. Hospitalist notified and pt is now to be admitted. Pt notified.

## 2016-05-01 NOTE — Progress Notes (Signed)
Reason for Consult: Right upper quadrant abdominal pain Referring Physician: ER, Dr. Memon  Eileen [name] is an 35 y.o. female.  HPI: Patient is a 35-year-old Hispanic female who presents to the emergency room with a less than 24-hour history of right upper quadrant abdominal pain and nausea. She states she was eating eggs and started having the right upper quadrant abdominal pain. She denied any fever, chills, jaundice. This is her first episode. Workup in the emergency room found that she had cholecystitis with cholelithiasis. Her white blood cell count as well as liver enzyme tests were within normal limits. A surgery consultation was obtained. Currently, she has no abdominal pain and would like to go home.  Past Medical History:  Diagnosis Date  . Abscess 02/29/2016  . Cyst of ovary, right 11/29/2015  . History of PCOS     Past Surgical History:  Procedure Laterality Date  . INCISION AND DRAINAGE     right outer leg    Family History  Problem Relation Age of Onset  . Arthritis/Rheumatoid Father   . Diabetes Mother   . Diabetes Sister     Social History:  reports that she has never smoked. She has never used smokeless tobacco. She reports that she does not drink alcohol or use drugs.  Allergies: No Known Allergies  Medications:  Prior to Admission:  (Not in a hospital admission) Scheduled: .  morphine injection  4 mg Intravenous Once    Results for orders placed or performed during the hospital encounter of 05/01/16 (from the past 48 hour(s))  Lipase, blood     Status: None   Collection Time: 05/01/16  1:51 AM  Result Value Ref Range   Lipase 21 11 - 51 U/L  Comprehensive metabolic panel     Status: Abnormal   Collection Time: 05/01/16  1:51 AM  Result Value Ref Range   Sodium 136 135 - 145 mmol/L   Potassium 3.5 3.5 - 5.1 mmol/L   Chloride 100 (L) 101 - 111 mmol/L   CO2 29 22 - 32 mmol/L   Glucose, Bld 109 (H) 65 - 99 mg/dL   BUN 11 6 - 20 mg/dL   Creatinine, Ser 0.60 0.44 - 1.00 mg/dL   Calcium 9.3 8.9 - 10.3 mg/dL   Total Protein 7.1 6.5 - 8.1 g/dL   Albumin 4.2 3.5 - 5.0 g/dL   AST 18 15 - 41 U/L   ALT 21 14 - 54 U/L   Alkaline Phosphatase 52 38 - 126 U/L   Total Bilirubin 0.3 0.3 - 1.2 mg/dL   GFR calc non Af Amer >60 >60 mL/min   GFR calc Af Amer >60 >60 mL/min    Comment: (NOTE) The eGFR has been calculated using the CKD EPI equation. This calculation has not been validated in all clinical situations. eGFR's persistently <60 mL/min signify possible Chronic Kidney Disease.    Anion gap 7 5 - 15  CBC     Status: None   Collection Time: 05/01/16  1:51 AM  Result Value Ref Range   WBC 8.8 4.0 - 10.5 K/uL   RBC 4.66 3.87 - 5.11 MIL/uL   Hemoglobin 14.0 12.0 - 15.0 g/dL   HCT 40.3 36.0 - 46.0 %   MCV 86.5 78.0 - 100.0 fL   MCH 30.0 26.0 - 34.0 pg   MCHC 34.7 30.0 - 36.0 g/dL   RDW 13.0 11.5 - 15.5 %   Platelets 288 150 - 400 K/uL  Urinalysis, Routine w reflex microscopic       Status: Abnormal   Collection Time: 05/01/16  3:30 AM  Result Value Ref Range   Color, Urine YELLOW YELLOW   APPearance CLOUDY (A) CLEAR   Specific Gravity, Urine 1.027 1.005 - 1.030   pH 5.0 5.0 - 8.0   Glucose, UA NEGATIVE NEGATIVE mg/dL   Hgb urine dipstick NEGATIVE NEGATIVE   Bilirubin Urine NEGATIVE NEGATIVE   Ketones, ur NEGATIVE NEGATIVE mg/dL   Protein, ur NEGATIVE NEGATIVE mg/dL   Nitrite NEGATIVE NEGATIVE   Leukocytes, UA NEGATIVE NEGATIVE  Pregnancy, urine     Status: None   Collection Time: 05/01/16  3:30 AM  Result Value Ref Range   Preg Test, Ur NEGATIVE NEGATIVE    Ct Abdomen Pelvis W Contrast  Result Date: 05/01/2016 CLINICAL DATA:  Upper abdominal pain for 10 days, worsening. Vomiting. Right upper quadrant pain tonight. EXAM: CT ABDOMEN AND PELVIS WITH CONTRAST TECHNIQUE: Multidetector CT imaging of the abdomen and pelvis was performed using the standard protocol following bolus administration of intravenous contrast.  CONTRAST:  30mL ISOVUE-300 IOPAMIDOL (ISOVUE-300) INJECTION 61%, 100mL ISOVUE-300 IOPAMIDOL (ISOVUE-300) INJECTION 61% COMPARISON:  05/26/2004 FINDINGS: Lower chest: Lung bases are clear. Hepatobiliary: Cholelithiasis with mild gallbladder wall thickening and pericholecystic edema. Changes could indicate cholecystitis in the appropriate clinical setting. No bile duct dilatation. No focal liver lesions. Pancreas: Unremarkable. No pancreatic ductal dilatation or surrounding inflammatory changes. Spleen: Normal in size without focal abnormality. Adrenals/Urinary Tract: Adrenal glands are unremarkable. Kidneys are normal, without renal calculi, focal lesion, or hydronephrosis. Bladder is unremarkable. Stomach/Bowel: Stomach is within normal limits. Appendix appears normal. No evidence of bowel wall thickening, distention, or inflammatory changes. Vascular/Lymphatic: No significant vascular findings are present. No enlarged abdominal or pelvic lymph nodes. Reproductive: Cystic changes in the right ovary measuring up to about 3 cm diameter. This is likely functional. Uterus and left ovary are unremarkable. Other: No abdominal wall hernia or abnormality. No abdominopelvic ascites. Musculoskeletal: No acute or significant osseous findings. IMPRESSION: Cholelithiasis with gallbladder wall thickening and pericholecystic edema suggesting acute cholecystitis in the appropriate clinical setting. Right ovarian cyst is likely functional. Electronically Signed   By: William  Stevens M.D.   On: 05/01/2016 05:11    ROS:  Pertinent items noted in HPI and remainder of comprehensive ROS otherwise negative.  Blood pressure 121/73, pulse 67, temperature 97.9 F (36.6 C), temperature source Oral, resp. rate 10, height 5' 1" (1.549 m), weight 193 lb (87.5 kg), last menstrual period 04/20/2016, SpO2 98 %, unknown if currently breastfeeding. Physical Exam: Well-developed well-nourished Hispanic female in no acute distress. Head is  normocephalic, atraumatic. Eyes reveal no scleral icterus. Neck is supple without lymphadenopathy. Lungs clear auscultation with breath sounds bilaterally. Heart examination reveals a regular rate and rhythm without S3, S4, murmurs. Abdomen is soft with minimal tenderness in the right upper quadrant to palpation. No hepatosplenomegaly, masses, hernias identified. No rigidity is noted. CT scan of abdomen report reviewed.  Assessment/Plan: Impression: Biliary colic, cholelithiasis. Currently she has minimal right upper quadrant abdominal pain. No leukocytosis or liver enzyme tests elevation. Plan: Patient would like to go home. We will see whether she tolerates regular diet this morning. Should she be discharged, she was instructed follow-up in my office. Discussed with Dr. Memon.  Balian Schaller 05/01/2016, 8:46 AM     

## 2016-05-01 NOTE — ED Notes (Signed)
Attempted to call report x1, RN unavailable.

## 2016-05-02 ENCOUNTER — Inpatient Hospital Stay (HOSPITAL_COMMUNITY): Payer: Self-pay | Admitting: Anesthesiology

## 2016-05-02 ENCOUNTER — Encounter (HOSPITAL_COMMUNITY)
Admission: EM | Disposition: A | Discharge status: Home / Self Care | Payer: Self-pay | Source: Home / Self Care | Attending: General Surgery

## 2016-05-02 ENCOUNTER — Encounter (HOSPITAL_COMMUNITY): Payer: Self-pay | Admitting: *Deleted

## 2016-05-02 HISTORY — PX: CHOLECYSTECTOMY: SHX55

## 2016-05-02 LAB — CBC
HEMATOCRIT: 41.7 % (ref 36.0–46.0)
HEMOGLOBIN: 14.3 g/dL (ref 12.0–15.0)
MCH: 29.9 pg (ref 26.0–34.0)
MCHC: 34.3 g/dL (ref 30.0–36.0)
MCV: 87.1 fL (ref 78.0–100.0)
Platelets: 290 10*3/uL (ref 150–400)
RBC: 4.79 MIL/uL (ref 3.87–5.11)
RDW: 13.1 % (ref 11.5–15.5)
WBC: 8 10*3/uL (ref 4.0–10.5)

## 2016-05-02 LAB — COMPREHENSIVE METABOLIC PANEL
ALBUMIN: 4.1 g/dL (ref 3.5–5.0)
ALK PHOS: 58 U/L (ref 38–126)
ALT: 21 U/L (ref 14–54)
AST: 16 U/L (ref 15–41)
Anion gap: 7 (ref 5–15)
BILIRUBIN TOTAL: 0.5 mg/dL (ref 0.3–1.2)
BUN: 5 mg/dL — ABNORMAL LOW (ref 6–20)
CALCIUM: 9.1 mg/dL (ref 8.9–10.3)
CO2: 28 mmol/L (ref 22–32)
Chloride: 100 mmol/L — ABNORMAL LOW (ref 101–111)
Creatinine, Ser: 0.59 mg/dL (ref 0.44–1.00)
GFR calc Af Amer: >60 mL/min (ref >60)
GLUCOSE: 107 mg/dL — AB (ref 65–99)
Potassium: 4.3 mmol/L (ref 3.5–5.1)
Sodium: 135 mmol/L (ref 135–145)
TOTAL PROTEIN: 7.2 g/dL (ref 6.5–8.1)

## 2016-05-02 LAB — SURGICAL PCR SCREEN
MRSA, PCR: POSITIVE — AB
Staphylococcus aureus: POSITIVE — AB

## 2016-05-02 LAB — HIV ANTIBODY (ROUTINE TESTING W REFLEX): HIV SCREEN 4TH GENERATION: NONREACTIVE

## 2016-05-02 SURGERY — LAPAROSCOPIC CHOLECYSTECTOMY
Anesthesia: General

## 2016-05-02 MED ORDER — DIPHENHYDRAMINE HCL 25 MG PO CAPS: 25.0000 mg | ORAL_CAPSULE | Freq: Four times a day (QID) | ORAL | Status: DC | PRN

## 2016-05-02 MED ORDER — FENTANYL CITRATE (PF) 100 MCG/2ML IJ SOLN
INTRAMUSCULAR | Status: DC | PRN
  Administered 2016-05-02 (×4): 50 ug via INTRAVENOUS

## 2016-05-02 MED ORDER — CHLORHEXIDINE GLUCONATE CLOTH 2 % EX PADS
6.0000 | MEDICATED_PAD | Freq: Once | CUTANEOUS | Status: AC
  Administered 2016-05-02: 6 via TOPICAL

## 2016-05-02 MED ORDER — OXYCODONE-ACETAMINOPHEN 5-325 MG PO TABS
1.0000 | ORAL_TABLET | ORAL | Status: DC | PRN
  Administered 2016-05-03: 1 via ORAL
  Filled 2016-05-02: qty 1

## 2016-05-02 MED ORDER — NEOSTIGMINE METHYLSULFATE 10 MG/10ML IV SOLN
INTRAVENOUS | Status: DC | PRN
  Administered 2016-05-02: 3 mg via INTRAVENOUS

## 2016-05-02 MED ORDER — SODIUM CHLORIDE 0.9 % IR SOLN
Status: DC | PRN
  Administered 2016-05-02: 1000 mL

## 2016-05-02 MED ORDER — DEXTROSE 5 % IV SOLN
2.0000 g | INTRAVENOUS | Status: DC
  Filled 2016-05-02 (×2): qty 2

## 2016-05-02 MED ORDER — BUPIVACAINE HCL (PF) 0.5 % IJ SOLN
INTRAMUSCULAR | Status: AC
  Filled 2016-05-02: qty 30

## 2016-05-02 MED ORDER — ACETAMINOPHEN 325 MG PO TABS: 650.0000 mg | ORAL_TABLET | Freq: Four times a day (QID) | ORAL | Status: DC | PRN

## 2016-05-02 MED ORDER — SUCCINYLCHOLINE 20MG/ML (10ML) SYRINGE FOR MEDFUSION PUMP - OPTIME
INTRAMUSCULAR | Status: DC | PRN
  Administered 2016-05-02: 120 mg via INTRAVENOUS

## 2016-05-02 MED ORDER — LACTATED RINGERS IV SOLN
INTRAVENOUS | Status: DC
  Administered 2016-05-02 (×2): via INTRAVENOUS

## 2016-05-02 MED ORDER — GLYCOPYRROLATE 0.2 MG/ML IJ SOLN
INTRAMUSCULAR | Status: DC | PRN
  Administered 2016-05-02: .6 mg via INTRAVENOUS

## 2016-05-02 MED ORDER — ROCURONIUM 10MG/ML (10ML) SYRINGE FOR MEDFUSION PUMP - OPTIME
INTRAVENOUS | Status: DC | PRN
  Administered 2016-05-02: 5 mg via INTRAVENOUS
  Administered 2016-05-02: 25 mg via INTRAVENOUS

## 2016-05-02 MED ORDER — GLYCOPYRROLATE 0.2 MG/ML IJ SOLN
INTRAMUSCULAR | Status: AC
  Filled 2016-05-02: qty 3

## 2016-05-02 MED ORDER — PROPOFOL 10 MG/ML IV BOLUS
INTRAVENOUS | Status: DC | PRN
  Administered 2016-05-02: 160 mg via INTRAVENOUS

## 2016-05-02 MED ORDER — ENOXAPARIN SODIUM 40 MG/0.4ML ~~LOC~~ SOLN: 40.0000 mg | SUBCUTANEOUS | Status: DC

## 2016-05-02 MED ORDER — ROCURONIUM BROMIDE 50 MG/5ML IV SOLN
INTRAVENOUS | Status: AC
  Filled 2016-05-02: qty 1

## 2016-05-02 MED ORDER — FENTANYL CITRATE (PF) 250 MCG/5ML IJ SOLN
INTRAMUSCULAR | Status: AC
  Filled 2016-05-02: qty 5

## 2016-05-02 MED ORDER — ONDANSETRON HCL 4 MG/2ML IJ SOLN
4.0000 mg | Freq: Once | INTRAMUSCULAR | Status: AC
  Administered 2016-05-02: 4 mg via INTRAVENOUS

## 2016-05-02 MED ORDER — HYDROMORPHONE HCL 1 MG/ML IJ SOLN
0.2500 mg | INTRAMUSCULAR | Status: DC | PRN
  Administered 2016-05-02: 0.5 mg via INTRAVENOUS
  Filled 2016-05-02: qty 0.5

## 2016-05-02 MED ORDER — MUPIROCIN 2 % EX OINT
1.0000 "application " | TOPICAL_OINTMENT | Freq: Two times a day (BID) | CUTANEOUS | Status: DC
  Administered 2016-05-02: 1 via NASAL
  Filled 2016-05-02: qty 22

## 2016-05-02 MED ORDER — ONDANSETRON HCL 4 MG/2ML IJ SOLN: 4.0000 mg | Freq: Four times a day (QID) | INTRAMUSCULAR | Status: DC | PRN

## 2016-05-02 MED ORDER — LIDOCAINE HCL (PF) 1 % IJ SOLN
INTRAMUSCULAR | Status: AC
  Filled 2016-05-02: qty 5

## 2016-05-02 MED ORDER — LACTATED RINGERS IV SOLN
INTRAVENOUS | Status: DC
  Administered 2016-05-02: 16:00:00 via INTRAVENOUS

## 2016-05-02 MED ORDER — BUPIVACAINE HCL (PF) 0.5 % IJ SOLN
INTRAMUSCULAR | Status: DC | PRN
  Administered 2016-05-02: 10 mL

## 2016-05-02 MED ORDER — MIDAZOLAM HCL 2 MG/2ML IJ SOLN
INTRAMUSCULAR | Status: AC
  Filled 2016-05-02: qty 2

## 2016-05-02 MED ORDER — HEMOSTATIC AGENTS (NO CHARGE) OPTIME
TOPICAL | Status: DC | PRN
Start: 2016-05-02 — End: 2016-05-02
  Administered 2016-05-02: 1 via TOPICAL

## 2016-05-02 MED ORDER — PROPOFOL 10 MG/ML IV BOLUS
INTRAVENOUS | Status: AC
  Filled 2016-05-02: qty 20

## 2016-05-02 MED ORDER — ACETAMINOPHEN 650 MG RE SUPP: 650.0000 mg | Freq: Four times a day (QID) | RECTAL | Status: DC | PRN

## 2016-05-02 MED ORDER — DIPHENHYDRAMINE HCL 50 MG/ML IJ SOLN
25.0000 mg | Freq: Four times a day (QID) | INTRAMUSCULAR | Status: DC | PRN
Start: 2016-05-02 — End: 2016-05-03

## 2016-05-02 MED ORDER — ONDANSETRON HCL 4 MG/2ML IJ SOLN
INTRAMUSCULAR | Status: AC
  Filled 2016-05-02: qty 2

## 2016-05-02 MED ORDER — CHLORHEXIDINE GLUCONATE CLOTH 2 % EX PADS: 6.0000 | MEDICATED_PAD | Freq: Every day | CUTANEOUS | Status: DC

## 2016-05-02 MED ORDER — MIDAZOLAM HCL 2 MG/2ML IJ SOLN
1.0000 mg | INTRAMUSCULAR | Status: AC
  Administered 2016-05-02 (×2): 2 mg via INTRAVENOUS
  Filled 2016-05-02: qty 2

## 2016-05-02 MED ORDER — SIMETHICONE 80 MG PO CHEW: 40.0000 mg | CHEWABLE_TABLET | Freq: Four times a day (QID) | ORAL | Status: DC | PRN

## 2016-05-02 MED ORDER — LIDOCAINE HCL (CARDIAC) 10 MG/ML IV SOLN
INTRAVENOUS | Status: DC | PRN
  Administered 2016-05-02: 50 mg via INTRAVENOUS

## 2016-05-02 MED ORDER — ONDANSETRON 4 MG PO TBDP: 4.0000 mg | ORAL_TABLET | Freq: Four times a day (QID) | ORAL | Status: DC | PRN

## 2016-05-02 MED ORDER — KETOROLAC TROMETHAMINE 30 MG/ML IJ SOLN: 30.0000 mg | Freq: Four times a day (QID) | INTRAMUSCULAR | Status: DC | PRN

## 2016-05-02 MED ORDER — HYDROMORPHONE HCL 1 MG/ML IJ SOLN
1.0000 mg | INTRAMUSCULAR | Status: DC | PRN
Start: 2016-05-02 — End: 2016-05-03
  Administered 2016-05-02: 1 mg via INTRAVENOUS
  Filled 2016-05-02: qty 1

## 2016-05-02 MED ORDER — POVIDONE-IODINE 10 % EX OINT
TOPICAL_OINTMENT | CUTANEOUS | Status: AC
  Filled 2016-05-02: qty 1

## 2016-05-02 MED ORDER — POVIDONE-IODINE 10 % OINT PACKET
TOPICAL_OINTMENT | CUTANEOUS | Status: DC | PRN
  Administered 2016-05-02: 1 via TOPICAL

## 2016-05-02 MED ORDER — KETOROLAC TROMETHAMINE 30 MG/ML IJ SOLN
30.0000 mg | Freq: Four times a day (QID) | INTRAMUSCULAR | Status: DC
  Administered 2016-05-02 – 2016-05-03 (×4): 30 mg via INTRAVENOUS
  Filled 2016-05-02 (×4): qty 1

## 2016-05-02 SURGICAL SUPPLY — 43 items
APPLIER CLIP LAPSCP 10X32 DD (CLIP) ×3 IMPLANT
BAG HAMPER (MISCELLANEOUS) ×3 IMPLANT
BAG SPEC RTRVL LRG 6X4 10 (ENDOMECHANICALS) ×1
CHLORAPREP W/TINT 26ML (MISCELLANEOUS) ×3 IMPLANT
CLOTH BEACON ORANGE TIMEOUT ST (SAFETY) ×3 IMPLANT
COVER LIGHT HANDLE STERIS (MISCELLANEOUS) ×6 IMPLANT
DECANTER SPIKE VIAL GLASS SM (MISCELLANEOUS) ×3 IMPLANT
ELECT REM PT RETURN 9FT ADLT (ELECTROSURGICAL) ×3
ELECTRODE REM PT RTRN 9FT ADLT (ELECTROSURGICAL) ×1 IMPLANT
FILTER SMOKE EVAC LAPAROSHD (FILTER) ×3 IMPLANT
FORMALIN 10 PREFIL 120ML (MISCELLANEOUS) ×3 IMPLANT
GLOVE BIOGEL PI IND STRL 7.0 (GLOVE) ×1 IMPLANT
GLOVE BIOGEL PI INDICATOR 7.0 (GLOVE) ×2
GLOVE SURG SS PI 7.5 STRL IVOR (GLOVE) ×3 IMPLANT
GOWN STRL REUS W/ TWL XL LVL3 (GOWN DISPOSABLE) ×1 IMPLANT
GOWN STRL REUS W/TWL LRG LVL3 (GOWN DISPOSABLE) ×6 IMPLANT
GOWN STRL REUS W/TWL XL LVL3 (GOWN DISPOSABLE) ×3
HEMOSTAT SNOW SURGICEL 2X4 (HEMOSTASIS) ×3 IMPLANT
INST SET LAPROSCOPIC AP (KITS) ×3 IMPLANT
IV NS IRRIG 3000ML ARTHROMATIC (IV SOLUTION) IMPLANT
KIT ROOM TURNOVER APOR (KITS) ×3 IMPLANT
MANIFOLD NEPTUNE II (INSTRUMENTS) ×3 IMPLANT
NDL INSUFFLATION 14GA 120MM (NEEDLE) ×1 IMPLANT
NEEDLE INSUFFLATION 14GA 120MM (NEEDLE) ×3 IMPLANT
NS IRRIG 1000ML POUR BTL (IV SOLUTION) ×3 IMPLANT
PACK LAP CHOLE LZT030E (CUSTOM PROCEDURE TRAY) ×3 IMPLANT
PAD ARMBOARD 7.5X6 YLW CONV (MISCELLANEOUS) ×3 IMPLANT
POUCH SPECIMEN RETRIEVAL 10MM (ENDOMECHANICALS) ×3 IMPLANT
SET BASIN LINEN APH (SET/KITS/TRAYS/PACK) ×3 IMPLANT
SET TUBE IRRIG SUCTION NO TIP (IRRIGATION / IRRIGATOR) IMPLANT
SLEEVE ENDOPATH XCEL 5M (ENDOMECHANICALS) ×3 IMPLANT
SPONGE GAUZE 2X2 8PLY STER LF (GAUZE/BANDAGES/DRESSINGS) ×1
SPONGE GAUZE 2X2 8PLY STRL LF (GAUZE/BANDAGES/DRESSINGS) ×5 IMPLANT
STAPLER VISISTAT (STAPLE) ×3 IMPLANT
SUT VICRYL 0 UR6 27IN ABS (SUTURE) ×5 IMPLANT
TAPE PAPER 3X10 WHT MICROPORE (GAUZE/BANDAGES/DRESSINGS) ×2 IMPLANT
TROCAR ENDO BLADELESS 11MM (ENDOMECHANICALS) ×3 IMPLANT
TROCAR XCEL NON-BLD 5MMX100MML (ENDOMECHANICALS) ×3 IMPLANT
TROCAR XCEL UNIV SLVE 11M 100M (ENDOMECHANICALS) ×3 IMPLANT
TUBE CONNECTING 12'X1/4 (SUCTIONS) ×1
TUBE CONNECTING 12X1/4 (SUCTIONS) ×2 IMPLANT
TUBING INSUFFLATION (TUBING) ×3 IMPLANT
WARMER LAPAROSCOPE (MISCELLANEOUS) ×3 IMPLANT

## 2016-05-02 NOTE — Interval H&P Note (Signed)
History and Physical Interval Note:  05/02/2016 12:28 PM  Eileen [name]  has presented today for surgery, with the diagnosis of acute cholecystitis, cholelithiasis  The various methods of treatment have been discussed with the patient and family. After consideration of risks, benefits and other options for treatment, the patient has consented to  Procedure(s): LAPAROSCOPIC CHOLECYSTECTOMY (N/A) as a surgical intervention .  The patient's history has been reviewed, patient examined, no change in status, stable for surgery.  I have reviewed the patient's chart and labs.  Questions were answered to the patient's satisfaction.     Franky MachoMark Pansy Ostrovsky

## 2016-05-02 NOTE — Op Note (Signed)
Patient:  Eileen [name]  DOB:  [birth year]  MRN:  [id number]   Preop Diagnosis:  Cholecystitis, cholelithiasis  Postop Diagnosis:  Same  Procedure:  Laparoscopic cholecystectomy  Surgeon:  Franky MachoMark Lailani Tool, M.D.  Anes:  Gen. endotracheal  Indications:  Patient is a 36 year old Hispanic female who presents with cholecystitis secondary to cholelithiasis. The risks and benefits of the procedure including bleeding, infection, a periumbilical injury, the possibility of an upper procedure were fully explained to the patient, who gave informed consent.  Procedure note:  The patient was placed in the supine position. After induction of general endotracheal anesthesia, the abdomen was prepped and draped using the usual sterile technique with DuraPrep. Surgical site confirmation was performed.  A supraumbilical incision was made to the fascia. A Veress needle was introduced into the abdominal cavity confirmation of placement was done using the saline drop test. The abdomen was then insufflated to 16 mmHg pressure. 11 mm trocar was introduced into the abdominal cavity under direct visualization without difficulty. The patient was placed in reverse Trendelenburg position and an additional 11 mm trocar was placed the epigastric region and 5 mm trochars were placed right upper quadrant and right flank regions. The liver was inspected and noted to be within normal limits. The gallbladder was noted to be acutely inflamed and distended. The gallbladder was decompressed at its fundus in order to facilitate exposure. The gallbladder was then retracted in a dynamic fashion in order to provide a critical view of the triangle of Calot. The cystic duct was first identified. Its juncture to the infundibulum was fully identified. Endoclips placed proximally and distally on the cystic duct, and the cystic duct was divided. This was likewise done to the cystic artery. The gallbladder was freed away from the gallbladder  fossa using Bovie electrocautery. The gallbladder was delivered to the epigastric trocar site using the Endo Catch bag. The gallbladder fossa was inspected and no abnormal bleeding or bile leakage was noted. Surgicel was placed in the gallbladder fossa. All fluid and air were then evacuated from the abdominal cavity prior to the removal of the trochars.  All wounds were irrigated with normal saline. All wounds were injected with 0.5 since escaping. The supraumbilical fascia as well as epigastric fascia reapproximated using 0 Vicryl interrupted sutures. All skin incisions were closed using staples. Betadine ointment and dry sterile dressings were applied.  All tape and needle counts were correct at the end of the procedure. The patient was extubated in the operating room and transferred to PACU in stable condition.  Complications:  None  EBL:  Minimal  Specimen:  Gallbladder

## 2016-05-02 NOTE — H&P (View-Only) (Signed)
Reason for Consult: Right upper quadrant abdominal pain Referring Physician: ER, Dr. Kirke Shaggy Palacios-Benitez is an 36 y.o. female.  HPI: Patient is a 36 year old Hispanic female who presents to the emergency room with a less than 24-hour history of right upper quadrant abdominal pain and nausea. She states she was eating eggs and started having the right upper quadrant abdominal pain. She denied any fever, chills, jaundice. This is her first episode. Workup in the emergency room found that she had cholecystitis with cholelithiasis. Her white blood cell count as well as liver enzyme tests were within normal limits. A surgery consultation was obtained. Currently, she has no abdominal pain and would like to go home.  Past Medical History:  Diagnosis Date  . Abscess 02/29/2016  . Cyst of ovary, right 11/29/2015  . History of PCOS     Past Surgical History:  Procedure Laterality Date  . INCISION AND DRAINAGE     right outer leg    Family History  Problem Relation Age of Onset  . Arthritis/Rheumatoid Father   . Diabetes Mother   . Diabetes Sister     Social History:  reports that she has never smoked. She has never used smokeless tobacco. She reports that she does not drink alcohol or use drugs.  Allergies: No Known Allergies  Medications:  Prior to Admission:  (Not in a hospital admission) Scheduled: .  morphine injection  4 mg Intravenous Once    Results for orders placed or performed during the hospital encounter of 05/01/16 (from the past 48 hour(s))  Lipase, blood     Status: None   Collection Time: 05/01/16  1:51 AM  Result Value Ref Range   Lipase 21 11 - 51 U/L  Comprehensive metabolic panel     Status: Abnormal   Collection Time: 05/01/16  1:51 AM  Result Value Ref Range   Sodium 136 135 - 145 mmol/L   Potassium 3.5 3.5 - 5.1 mmol/L   Chloride 100 (L) 101 - 111 mmol/L   CO2 29 22 - 32 mmol/L   Glucose, Bld 109 (H) 65 - 99 mg/dL   BUN 11 6 - 20 mg/dL   Creatinine, Ser 0.60 0.44 - 1.00 mg/dL   Calcium 9.3 8.9 - 10.3 mg/dL   Total Protein 7.1 6.5 - 8.1 g/dL   Albumin 4.2 3.5 - 5.0 g/dL   AST 18 15 - 41 U/L   ALT 21 14 - 54 U/L   Alkaline Phosphatase 52 38 - 126 U/L   Total Bilirubin 0.3 0.3 - 1.2 mg/dL   GFR calc non Af Amer >60 >60 mL/min   GFR calc Af Amer >60 >60 mL/min    Comment: (NOTE) The eGFR has been calculated using the CKD EPI equation. This calculation has not been validated in all clinical situations. eGFR's persistently <60 mL/min signify possible Chronic Kidney Disease.    Anion gap 7 5 - 15  CBC     Status: None   Collection Time: 05/01/16  1:51 AM  Result Value Ref Range   WBC 8.8 4.0 - 10.5 K/uL   RBC 4.66 3.87 - 5.11 MIL/uL   Hemoglobin 14.0 12.0 - 15.0 g/dL   HCT 40.3 36.0 - 46.0 %   MCV 86.5 78.0 - 100.0 fL   MCH 30.0 26.0 - 34.0 pg   MCHC 34.7 30.0 - 36.0 g/dL   RDW 13.0 11.5 - 15.5 %   Platelets 288 150 - 400 K/uL  Urinalysis, Routine w reflex microscopic  Status: Abnormal   Collection Time: 05/01/16  3:30 AM  Result Value Ref Range   Color, Urine YELLOW YELLOW   APPearance CLOUDY (A) CLEAR   Specific Gravity, Urine 1.027 1.005 - 1.030   pH 5.0 5.0 - 8.0   Glucose, UA NEGATIVE NEGATIVE mg/dL   Hgb urine dipstick NEGATIVE NEGATIVE   Bilirubin Urine NEGATIVE NEGATIVE   Ketones, ur NEGATIVE NEGATIVE mg/dL   Protein, ur NEGATIVE NEGATIVE mg/dL   Nitrite NEGATIVE NEGATIVE   Leukocytes, UA NEGATIVE NEGATIVE  Pregnancy, urine     Status: None   Collection Time: 05/01/16  3:30 AM  Result Value Ref Range   Preg Test, Ur NEGATIVE NEGATIVE    Ct Abdomen Pelvis W Contrast  Result Date: 05/01/2016 CLINICAL DATA:  Upper abdominal pain for 10 days, worsening. Vomiting. Right upper quadrant pain tonight. EXAM: CT ABDOMEN AND PELVIS WITH CONTRAST TECHNIQUE: Multidetector CT imaging of the abdomen and pelvis was performed using the standard protocol following bolus administration of intravenous contrast.  CONTRAST:  37m ISOVUE-300 IOPAMIDOL (ISOVUE-300) INJECTION 61%, 1060mISOVUE-300 IOPAMIDOL (ISOVUE-300) INJECTION 61% COMPARISON:  05/26/2004 FINDINGS: Lower chest: Lung bases are clear. Hepatobiliary: Cholelithiasis with mild gallbladder wall thickening and pericholecystic edema. Changes could indicate cholecystitis in the appropriate clinical setting. No bile duct dilatation. No focal liver lesions. Pancreas: Unremarkable. No pancreatic ductal dilatation or surrounding inflammatory changes. Spleen: Normal in size without focal abnormality. Adrenals/Urinary Tract: Adrenal glands are unremarkable. Kidneys are normal, without renal calculi, focal lesion, or hydronephrosis. Bladder is unremarkable. Stomach/Bowel: Stomach is within normal limits. Appendix appears normal. No evidence of bowel wall thickening, distention, or inflammatory changes. Vascular/Lymphatic: No significant vascular findings are present. No enlarged abdominal or pelvic lymph nodes. Reproductive: Cystic changes in the right ovary measuring up to about 3 cm diameter. This is likely functional. Uterus and left ovary are unremarkable. Other: No abdominal wall hernia or abnormality. No abdominopelvic ascites. Musculoskeletal: No acute or significant osseous findings. IMPRESSION: Cholelithiasis with gallbladder wall thickening and pericholecystic edema suggesting acute cholecystitis in the appropriate clinical setting. Right ovarian cyst is likely functional. Electronically Signed   By: WiLucienne Capers.D.   On: 05/01/2016 05:11    ROS:  Pertinent items noted in HPI and remainder of comprehensive ROS otherwise negative.  Blood pressure 121/73, pulse 67, temperature 97.9 F (36.6 C), temperature source Oral, resp. rate 10, height _0  (1.549 m), weight 193 lb (87.5 kg), last menstrual period 04/20/2016, SpO2 98 %, unknown if currently breastfeeding. Physical Exam: Well-developed well-nourished Hispanic female in no acute distress. Head is  normocephalic, atraumatic. Eyes reveal no scleral icterus. Neck is supple without lymphadenopathy. Lungs clear auscultation with breath sounds bilaterally. Heart examination reveals a regular rate and rhythm without S3, S4, murmurs. Abdomen is soft with minimal tenderness in the right upper quadrant to palpation. No hepatosplenomegaly, masses, hernias identified. No rigidity is noted. CT scan of abdomen report reviewed.  Assessment/Plan: Impression: Biliary colic, cholelithiasis. Currently she has minimal right upper quadrant abdominal pain. No leukocytosis or liver enzyme tests elevation. Plan: Patient would like to go home. We will see whether she tolerates regular diet this morning. Should she be discharged, she was instructed follow-up in my office. Discussed with Dr. MeRoderic Palau MaAviva Signs/13/2018, 8:46 AM

## 2016-05-02 NOTE — Transfer of Care (Signed)
Immediate Anesthesia Transfer of Care Note  Patient: Eileen [name]  Procedure(s) Performed: Procedure(s): LAPAROSCOPIC CHOLECYSTECTOMY (N/A)  Patient Location: PACU  Anesthesia Type:General  Level of Consciousness: awake and alert   Airway & Oxygen Therapy: Patient Spontanous Breathing and Patient connected to face mask oxygen  Post-op Assessment: Report given to RN  Post vital signs: Reviewed and stable  Last Vitals:  Vitals:   05/02/16 1215 05/02/16 1230  BP: 123/80 127/78  Pulse:    Resp: (!) 23 (!) 24  Temp:      Last Pain:  Vitals:   05/02/16 1153  TempSrc: Oral  PainSc: 1       Patients Stated Pain Goal: 2 (05/02/16 1153)  Complications: No apparent anesthesia complications

## 2016-05-02 NOTE — Anesthesia Postprocedure Evaluation (Signed)
Anesthesia Post Note  Patient: Eileen [name]  Procedure(s) Performed: Procedure(s) (LRB): LAPAROSCOPIC CHOLECYSTECTOMY (N/A)  Patient location during evaluation: PACU Anesthesia Type: General Level of consciousness: awake and alert and oriented Pain management: pain level controlled Vital Signs Assessment: post-procedure vital signs reviewed and stable Respiratory status: spontaneous breathing Cardiovascular status: blood pressure returned to baseline : Dose opf Zofran in PACU which resolved nausea. Anesthetic complications: no     Last Vitals:  Vitals:   05/02/16 1430 05/02/16 1445  BP: 123/77 121/78  Pulse: 69 64  Resp: 13 16  Temp:      Last Pain:  Vitals:   05/02/16 1433  TempSrc:   PainSc: 5                  Embrie Mikkelsen

## 2016-05-02 NOTE — Progress Notes (Signed)
PROGRESS NOTE    Eileen [name]  WUJ:[id number]  DOB: [birth year]  DOA: 05/01/2016 PCP: Cyril Mourning, NP Outpatient Specialists:   Hospital course: Eileen [name]  is a 36 year old hispanic female who came to hospital after she developed right upper quadrant pain around 10 PM. Patient says that pain started after she ate her dinner of fried eggs. She had 4 episodes of nausea and vomiting last night. Pain is 10/10 in intensity. Did not have similar episodes of pain in the past. Denies fever. No chest pain or shortness of breath. No diarrhea.  CT of the abdomen pelvis was done, which showed cholelithiasis with gallbladder wall thickening and pericholecystic edema suggesting acute cholecystitis. General surgery was consulted by ED physician, Dr. Earlene Plater recommend admit to medicine and he will see patient in consultation.  Assessment & Plan:   Acute gallstone cholecystitis - Pt is NPO and planning for cholecystectomy later today.  Pain controlled now.  Pt hemodynamically stable and labs are good.    Consultants:  General surgery   Procedures:  Pending 3/14   Subjective: Pt without complaints.   Objective: Vitals:   05/01/16 1453 05/01/16 1924 05/01/16 2200 05/02/16 0612  BP:   124/73 129/85  Pulse:   73 70  Resp:   18 18  Temp:   98.8 F (37.1 C) 99.3 F (37.4 C)  TempSrc: Oral  Oral Oral  SpO2:  99% 100% 99%  Weight:      Height:        Intake/Output Summary (Last 24 hours) at 05/02/16 1114 Last data filed at 05/02/16 0541  Gross per 24 hour  Intake          1783.33 ml  Output                0 ml  Net          1783.33 ml   Filed Weights   05/01/16 0143  Weight: 87.5 kg (193 lb)    Exam:  General exam: awake. Alert, NAD.  Respiratory system: Clear. No increased work of breathing. Cardiovascular system: S1 & S2 heard, RRR. No JVD, murmurs, gallops, clicks or pedal edema. Gastrointestinal system: Abdomen is nondistended, soft. Normal bowel  sounds heard. Central nervous system: Alert and oriented. No focal neurological deficits. Extremities: no CCE.  Data Reviewed: Basic Metabolic Panel:  Recent Labs Lab 05/01/16 0151 05/02/16 0453  NA 136 135  K 3.5 4.3  CL 100* 100*  CO2 29 28  GLUCOSE 109* 107*  BUN 11 5*  CREATININE 0.60 0.59  CALCIUM 9.3 9.1   Liver Function Tests:  Recent Labs Lab 05/01/16 0151 05/02/16 0453  AST 18 16  ALT 21 21  ALKPHOS 52 58  BILITOT 0.3 0.5  PROT 7.1 7.2  ALBUMIN 4.2 4.1    Recent Labs Lab 05/01/16 0151  LIPASE 21   No results for input(s): AMMONIA in the last 168 hours. CBC:  Recent Labs Lab 05/01/16 0151 05/02/16 0453  WBC 8.8 8.0  HGB 14.0 14.3  HCT 40.3 41.7  MCV 86.5 87.1  PLT 288 290   Cardiac Enzymes: No results for input(s): CKTOTAL, CKMB, CKMBINDEX, TROPONINI in the last 168 hours. CBG (last 3)  No results for input(s): GLUCAP in the last 72 hours. Recent Results (from the past 240 hour(s))  Surgical pcr screen     Status: Abnormal   Collection Time: 05/01/16  7:20 PM  Result Value Ref Range Status   MRSA, PCR POSITIVE (A) NEGATIVE Final  Comment: RESULT CALLED TO, READ BACK BY AND VERIFIED WITH:  DANIEL,C @ 0027 ON 05/02/16 BY JUW    Staphylococcus aureus POSITIVE (A) NEGATIVE Final    Comment:        The Xpert SA Assay (FDA approved for NASAL specimens in patients over 36 years of age), is one component of a comprehensive surveillance program.  Test performance has been validated by [facility] for patients greater than or equal to 36 year old. It is not intended to diagnose infection nor to guide or monitor treatment.      Studies: Ct Abdomen Pelvis W Contrast  Result Date: 05/01/2016 CLINICAL DATA:  Upper abdominal pain for 10 days, worsening. Vomiting. Right upper quadrant pain tonight. EXAM: CT ABDOMEN AND PELVIS WITH CONTRAST TECHNIQUE: Multidetector CT imaging of the abdomen and pelvis was performed using the standard protocol  following bolus administration of intravenous contrast. CONTRAST:  30mL ISOVUE-300 IOPAMIDOL (ISOVUE-300) INJECTION 61%, 100mL ISOVUE-300 IOPAMIDOL (ISOVUE-300) INJECTION 61% COMPARISON:  05/26/2004 FINDINGS: Lower chest: Lung bases are clear. Hepatobiliary: Cholelithiasis with mild gallbladder wall thickening and pericholecystic edema. Changes could indicate cholecystitis in the appropriate clinical setting. No bile duct dilatation. No focal liver lesions. Pancreas: Unremarkable. No pancreatic ductal dilatation or surrounding inflammatory changes. Spleen: Normal in size without focal abnormality. Adrenals/Urinary Tract: Adrenal glands are unremarkable. Kidneys are normal, without renal calculi, focal lesion, or hydronephrosis. Bladder is unremarkable. Stomach/Bowel: Stomach is within normal limits. Appendix appears normal. No evidence of bowel wall thickening, distention, or inflammatory changes. Vascular/Lymphatic: No significant vascular findings are present. No enlarged abdominal or pelvic lymph nodes. Reproductive: Cystic changes in the right ovary measuring up to about 3 cm diameter. This is likely functional. Uterus and left ovary are unremarkable. Other: No abdominal wall hernia or abnormality. No abdominopelvic ascites. Musculoskeletal: No acute or significant osseous findings. IMPRESSION: Cholelithiasis with gallbladder wall thickening and pericholecystic edema suggesting acute cholecystitis in the appropriate clinical setting. Right ovarian cyst is likely functional. Electronically Signed   By: Burman NievesWilliam  Stevens M.D.   On: 05/01/2016 05:11     Scheduled Meds: . cefTRIAXone (ROCEPHIN)  IV  2 g Intravenous Q24H  . Chlorhexidine Gluconate Cloth  6 each Topical Q0600  . enoxaparin (LOVENOX) injection  40 mg Subcutaneous Q24H  . mupirocin ointment  1 application Nasal BID   Continuous Infusions: . sodium chloride 125 mL/hr at 05/02/16 0541    Active Problems:   Acute cholecystitis    Cholecystitis   Time spent:   Standley Dakinslanford Johnson, MD, FAAFP [facility] Pager [phone]  If 7PM-7AM, please contact night-coverage [email or web address] Password [email or web address] 05/02/2016, 11:14 AM    LOS: 1 day

## 2016-05-02 NOTE — Anesthesia Procedure Notes (Signed)
Procedure Name: Intubation Date/Time: 05/02/2016 1:21 PM Performed by: Tressie Stalker E Pre-anesthesia Checklist: Patient identified, Patient being monitored, Timeout performed, Emergency Drugs available and Suction available Patient Re-evaluated:Patient Re-evaluated prior to inductionOxygen Delivery Method: Circle system utilized Preoxygenation: Pre-oxygenation with 100% oxygen Intubation Type: IV induction, Cricoid Pressure applied and Rapid sequence Ventilation: Mask ventilation without difficulty Laryngoscope Size: Mac and 3 Grade View: Grade I Tube type: Oral Tube size: 7.0 mm Number of attempts: 1 Airway Equipment and Method: Stylet Placement Confirmation: ETT inserted through vocal cords under direct vision,  positive ETCO2 and breath sounds checked- equal and bilateral Secured at: 21 cm Tube secured with: Tape Dental Injury: Teeth and Oropharynx as per pre-operative assessment

## 2016-05-02 NOTE — Anesthesia Preprocedure Evaluation (Signed)
Anesthesia Evaluation  Patient identified by MRN, date of birth, ID band Patient awake    Reviewed: Allergy & Precautions, NPO status , Patient's Chart, lab work & pertinent test results  Airway Mallampati: III  TM Distance: >3 FB     Dental  (+) Teeth Intact   Pulmonary neg pulmonary ROS,    breath sounds clear to auscultation       Cardiovascular negative cardio ROS   Rhythm:Regular Rate:Normal     Neuro/Psych    GI/Hepatic GERD  ,  Endo/Other    Renal/GU      Musculoskeletal   Abdominal   Peds  Hematology   Anesthesia Other Findings   Reproductive/Obstetrics                            Anesthesia Physical Anesthesia Plan  ASA: II  Anesthesia Plan: General   Post-op Pain Management:    Induction: Intravenous, Rapid sequence and Cricoid pressure planned  Airway Management Planned: Oral ETT  Additional Equipment:   Intra-op Plan:   Post-operative Plan: Extubation in OR  Informed Consent: I have reviewed the patients History and Physical, chart, labs and discussed the procedure including the risks, benefits and alternatives for the proposed anesthesia with the patient or authorized representative who has indicated his/her understanding and acceptance.     Plan Discussed with:   Anesthesia Plan Comments:         Anesthesia Quick Evaluation

## 2016-05-02 NOTE — Progress Notes (Addendum)
Patient had 4 ounces of juice this morning.  Dr. Lovell SheehanJenkins notified.  No new orders.  Continue with NPO.  Surgery this afternoon.  Patient also had small amount of jello.  Dr. Lovell SheehanJenkins notified.

## 2016-05-03 LAB — HEPATIC FUNCTION PANEL
ALK PHOS: 46 U/L (ref 38–126)
ALT: 55 U/L — ABNORMAL HIGH (ref 14–54)
AST: 57 U/L — ABNORMAL HIGH (ref 15–41)
Albumin: 3.3 g/dL — ABNORMAL LOW (ref 3.5–5.0)
BILIRUBIN TOTAL: 0.4 mg/dL (ref 0.3–1.2)
Bilirubin, Direct: 0.1 mg/dL (ref 0.1–0.5)
Indirect Bilirubin: 0.3 mg/dL (ref 0.3–0.9)
TOTAL PROTEIN: 6.3 g/dL — AB (ref 6.5–8.1)

## 2016-05-03 LAB — BASIC METABOLIC PANEL
Anion gap: 4 — ABNORMAL LOW (ref 5–15)
BUN: 7 mg/dL (ref 6–20)
CALCIUM: 8.6 mg/dL — AB (ref 8.9–10.3)
CO2: 29 mmol/L (ref 22–32)
CREATININE: 0.65 mg/dL (ref 0.44–1.00)
Chloride: 101 mmol/L (ref 101–111)
GFR calc Af Amer: >60 mL/min (ref >60)
GLUCOSE: 98 mg/dL (ref 65–99)
Potassium: 3.8 mmol/L (ref 3.5–5.1)
SODIUM: 134 mmol/L — AB (ref 135–145)

## 2016-05-03 LAB — CBC
HCT: 36.6 % (ref 36.0–46.0)
Hemoglobin: 12.5 g/dL (ref 12.0–15.0)
MCH: 29.9 pg (ref 26.0–34.0)
MCHC: 34.2 g/dL (ref 30.0–36.0)
MCV: 87.6 fL (ref 78.0–100.0)
PLATELETS: 233 10*3/uL (ref 150–400)
RBC: 4.18 MIL/uL (ref 3.87–5.11)
RDW: 13.3 % (ref 11.5–15.5)
WBC: 6.2 10*3/uL (ref 4.0–10.5)

## 2016-05-03 MED ORDER — OXYCODONE-ACETAMINOPHEN 7.5-325 MG PO TABS: 1.0000 | ORAL_TABLET | ORAL | 0 refills | Status: DC | PRN

## 2016-05-03 NOTE — Discharge Instructions (Signed)
Colecistectoma laparoscpica, cuidados posteriores (Laparoscopic Cholecystectomy, Care After) Lea esta informacin sobre cmo cuidarse despus del procedimiento. El mdico tambin podr darle instrucciones ms especficas. Comunquese con el mdico si tiene problemas o preguntas. QU ESPERAR DESPUS DEL PROCEDIMIENTO Despus del procedimiento, es comn tener los siguientes sntomas:  Dolor en los lugares de la incisin. Le darn analgsicos para Human resources officercontrolar el dolor.  Nuseas o vmitos leves.  Meteorismo y Designer, fashion/clothingposiblemente dolor en el hombro debido al gas que se us durante el procedimiento. INSTRUCCIONES PARA EL CUIDADO EN EL HOGAR Cuidados de la incisin  Siga las indicaciones del mdico acerca del cuidado de las incisiones. Haga lo siguiente:  BorgWarnerLvese las manos con agua y jabn antes de Multimedia programmercambiar las vendas (vendaje). Use desinfectante para manos si no dispone de [place] y [place]n.  Cambie el vendaje como se lo haya indicado el mdico.  No retire los puntos (suturas), el QUALCOMMadhesivo para la piel o las tiras [place]hesivas. Es posible que estos deban quedar puestos en la piel durante 2semanas o ms tiempo. Si los bordes de las tiras [address] empiezan a despegarse y Scientific laboratory technicianenroscarse, puede recortar los que estn sueltos. No retire las tiras Agilent Technologiesadhesivas por completo a menos que el mdico se lo indique.  No tome baos de inmersin, no nade ni use el jacuzzi hasta que el mdico lo autorice. Pregntele al mdico si puede ducharse. Delle Reiningal vez solo le permitan tomar baos de [place]esponja.  Controle todos los das la zona de la incisin para detectar signos de infeccin. Est atento a los siguientes signos:  Aumento del enrojecimiento, de la hinchazn o del dolor.  Ms lquido [place] Janskyo sangre.  Calor.  Pus o mal olor. Actividad  No conduzca ni use maquinaria pesada mientras toma analgsicos recetados.  No levante ningn objeto que pese ms de 10libras (4,5kg) hasta que el mdico lo autorice.  No practique deportes de  contacto hasta que el mdico lo autorice.  No conduzca durante 24horas si le dieron un medicamento para ayudarlo a que se relaje (sedante).  Descanse todo lo que sea necesario. No regrese al Aleen Campitrabajo o a la escuela hasta que el mdico lo autorice. Instrucciones generales  Baxter Internationalome los medicamentos de venta libre y los recetados solamente como se lo haya indicado el mdico.  A fin de prevenir o tratar el estreimiento mientras toma analgsicos recetados, el mdico puede recomendarle lo siguiente:  Product managerBeber suficiente lquido para Pharmacologistmantener la orina clara o de color amarillo plido.  Tomar medicamentos recetados o de H. J. Heinzventa libre.  Consumir alimentos ricos en fibra, como frutas y verduras frescas, cereales integrales y frijoles.  Limitar el consumo de alimentos con alto contenido de grasas y azcares procesados, como alimentos fritos o dulces. SOLICITE ATENCIN MDICA SI:  Le aparece una erupcin cutnea.  Le aumenta el enrojecimiento, la hinchazn o el dolor alrededor de las incisiones.  Le sale ms lquido o sangre de las incisiones.  Las incisiones estn calientes al tacto.  Le sale pus o percibe mal olor de las incisiones.  Tiene fiebre.  Una o ms de las incisiones se abren. SOLICITE ATENCIN MDICA DE INMEDIATO SI:  Tiene dificultad para respirar.  Siente dolor en el pecho.  Siente ms dolor en los hombros.  Se desmaya o se siente mareado al ponerse de pie.  Siente un dolor intenso en el abdomen.  Tiene nuseas o vmitos durante ms de Civil engineer, contractingun da.  Siente dolor en la pierna. Esta informacin no tiene Theme park managercomo fin reemplazar el consejo del mdico. Asegrese de hacerle al mdico  cualquier pregunta que tenga. Document Released: 09/18/2010 Document Revised: 10/27/2014 Document Reviewed: 07/25/2015 Elsevier Interactive Patient Education  2017 Elsevier Inc. Colecistitis (Cholecystitis) La colecistitis es la inflamacin de la vescula biliar. A menudo se la conoce como ataque de la  vescula biliar. La vescula biliar es un rgano que tiene forma de pera y se encuentra debajo del hgado, del lado derecho del cuerpo. Almacena bilis, un lquido que ayuda a [employer]. Si la bilis se acumula en la vescula biliar, esta se inflama. La afeccin se puede presentar de manera repentina [place]). Los episodios reiterados de colecistitis aguda o los que son prolongados pueden derivar en una enfermedad crnica. La colecistitis es grave y requiere TEFL teacher. CAUSAS La causa ms frecuente de esta afeccin son los clculos biliares. Los clculos pueden obstruir los conductos (vas biliares) que transportan la bilis desde la vescula biliar, y esto causa la acumulacin de [name] lquido. Otras causas de esta afeccin incluyen lo siguiente:  Dao a la vescula biliar debido a una disminucin del flujo sanguneo.  Infecciones de las vas biliares.  Cicatrices y obstrucciones en las vas biliares.  Tumores en el hgado, el pncreas o la vescula biliar. FACTORES DE RIESGO Es ms probable que esta afeccin se manifieste en:  Las personas que tienen anemia drepanoctica.  Las personas que toman anticonceptivos o usan estrgeno.  Las personas que tienen enfermedad heptica por alcoholismo.  Las personas con cirrosis heptica.  Las personas que reciben alimentacin a travs de una vena (alimentacin parenteral).  Las [employer] and [employer] no consumen alimentos ni lquidos (hacen ayuno) durante mucho tiempo.  Las [employer].  Las Government social research officer rpidamente.  Las [place].  Las personas con Automotive engineer de triglicridos.  Las personas que tienen pancreatitis. SNTOMAS Los sntomas de esta afeccin incluyen lo siguiente:  Dolor abdominal, especialmente en la zona superior derecha del abdomen.  Dolor con la palpacin en el abdomen o meteorismo.  Nuseas.  Vmitos.  Grant Ruts.  Escalofros.  Coloracin amarillenta de la piel y la zona blanca de los  ojos (ictericia). DIAGNSTICO Esta afeccin se diagnostica mediante la historia clnica y un examen fsico. Tambin pueden hacerle otros estudios, por ejemplo:  Pruebas de diagnstico por imgenes, como:  Ecografa de la vescula biliar.  Tomografa computarizada del abdomen.  Gammagrafa hepatobiliar con cido iminodiactico (HIDA). Este estudio permite que el mdico observe el trnsito de la bilis desde el hgado a la vescula biliar y Elaina Hoops el intestino delgado.  Resonancia magntica.  Anlisis de sangre, como los siguientes:  Hemograma completo, porque el recuento de glbulos blancos puede ser ms alto que lo normal.  Pruebas funcionales hepticas, porque algunos niveles pueden ser ms altos que lo normal cuando hay determinados tipos de clculos biliares. TRATAMIENTO El tratamiento puede incluir lo siguiente:  Hacer ayuno durante cierto [place].  Lquidos por va IV.  Medicamentos para tratar Chief Technology Officer o los vmitos.  Antibiticos.  Ciruga para extirpar la vescula biliar (colecistectoma). Esta puede realizarse de inmediato o ms adelante. INSTRUCCIONES PARA EL CUIDADO EN EL HOGAR El cuidado en el hogar depender del tipo de [place]. En general:  Baxter International de venta libre y los recetados solamente como se lo haya indicado el mdico.  Si le recetaron un antibitico, tmelo como se lo haya indicado el mdico. No deje de tomar los antibiticos aunque comience a sentirse mejor.  Siga las indicaciones del mdico respecto de qu comer o beber. Cuando le permitan comer, no coma ni beba nada que desencadene los  sntomas.  Concurra a todas las visitas de control como se lo haya indicado el mdico. Esto es importante. SOLICITE ATENCIN MDICA SI:  El dolor no se alivia con los [employer].  Tiene fiebre. SOLICITE ATENCIN MDICA DE INMEDIATO SI:  El dolor se desplaza a otra parte del abdomen o a la espalda.  Sigue teniendo sntomas o tiene sntomas nuevos,  incluso con Scientist, research (medical). Esta informacin no tiene Theme park manager el consejo del mdico. Asegrese de hacerle al mdico cualquier pregunta que tenga. Document Released: 11/15/2004 Document Revised: 05/30/2015 Document Reviewed: 05/19/2014 Elsevier Interactive Patient Education  2017 Elsevier Inc.  Colecistectoma laparoscpica (Laparoscopic Cholecystectomy) Neomia Dear colecistectoma laparoscpica es un procedimiento que se realiza para extirpar la vescula biliar. La vescula biliar se ubica en la zona superior derecha del abdomen, detrs del hgado. Es una cavidad que almacena la bilis que se produce en el hgado. La bilis interviene en la digestin y absorcin de las grasas. La colecistectoma se realiza con frecuencia debido a la inflamacin de la vescula biliar (colecistitis). Esta afeccin normalmente se debe a la acumulacin de clculos biliares (colelitiasis) en la vescula biliar. Estos clculos pueden obstruir el flujo de la bilis, lo que produce inflamacin y Engineer, mining. En los [employer], podr ser [place]. Si no es necesario realizar [place] de [place], tendr tiempo para prepararse para el procedimiento. La [place] laparoscpica es Neomia Dear alternativa a la [place]. La ciruga laparoscpica tiene un tiempo de recuperacin ms corto. Es posible que tambin se examine el conducto coldoco durante el procedimiento. Si se encuentran clculos en el conducto coldoco, tal vez deban extirparse. INFORME A SU MDICO:  Cualquier alergia que tenga.  Todos los Walt Disney, incluidos vitaminas, hierbas, gotas oftlmicas, cremas y [address] de [address].  Problemas previos que usted o los [employer] de su familia hayan tenido con el uso de anestsicos.  Enfermedades de la sangre que tenga.  Si tiene cirugas previas.  Cualquier enfermedad que tenga. RIESGOS Y COMPLICACIONES En general, se trata de un procedimiento seguro. Sin embargo, pueden  presentarse problemas, por ejemplo:  Infeccin.  Hemorragia.  Reacciones alrgicas a los medicamentos.  Daos a Systems developer u otros rganos.  Un clculo que queda en el conducto coldoco.  Una filtracin de bilis del conducto cstico que se comprime cuando se extirpa la vescula biliar.  La necesidad de [name], que requiere una incisin ms grande en el abdomen. Esto puede ser necesario si el cirujano considera que no es seguro continuar con el procedimiento laparoscpico. ANTES DEL PROCEDIMIENTO  Consulte a su mdico acerca de estos temas:  Cambiar o suspender los medicamentos que toma habitualmente. Esto es muy importante si toma medicamentos para la diabetes o anticoagulantes.  Tomar medicamentos, como aspirina e ibuprofeno. Estos medicamentos pueden tener un efecto anticoagulante en la [place]. No tome estos medicamentos antes del procedimiento si el mdico le indica que no lo haga.  Siga las indicaciones del mdico respecto de las restricciones para las comidas o las bebidas.  Infrmele al mdico antes de la ciruga si se ha resfriado o si tiene una infeccin.  Haga planes para que una persona lo lleve de vuelta a su casa despus del procedimiento.  Pregntele al mdico cmo se Cabin crew o se Audiological scientist de la Leisure centre manager.  Pueden darle antibiticos para ayudar a prevenir las infecciones. PROCEDIMIENTO  Para reducir el riesgo de infecciones:  El equipo mdico se lavar o se Administrator, sports.  Le lavarn la piel con jabn.  Pueden colocarle una va intravenosa (IV) en una de las venas.  Le administrarn un medicamento para hacerlo dormir (anestesia general).  Le colocarn un tubo en la boca para que pueda respirar.  El cirujano le har varios cortes pequeos (incisiones) en el abdomen.  Se insertar un tubo delgado que emite luz (laparoscopio) y que tiene una pequea cmara en un extremo a travs de una de las incisiones. La  cmara del laparoscopio enviar una imagen a una pantalla de televisin (monitor) que se encuentra en el quirfano. De este modo, el cirujano tendr Neomia Dearuna buena visin del interior del abdomen.  Le inyectarn un gas en el abdomen. Esto expandir el abdomen para que el cirujano tenga ms lugar para Facilities managerhacer la ciruga.  El resto del instrumental necesario para el procedimiento se introducir a travs de las otras incisiones. Se extirpar la vescula biliar a travs de una de las incisiones.  Despus de la extirpacin de la vescula biliar, se cerrarn las incisiones con puntos (suturas), grapas o adhesivo para la piel.  Las incisiones pueden cubrirse con un vendaje (apsito). Este procedimiento puede variar segn el mdico y el hospital. DESPUS DEL PROCEDIMIENTO  Conley RollsLe controlarn con frecuencia la presin arterial, la frecuencia cardaca, la frecuencia respiratoria y Air cabin crewel nivel de oxgeno en la sangre hasta que hayan desaparecido los efectos de los medicamentos administrados.  Le darn analgsicos para Human resources officercontrolar el dolor, si es necesario. Esta informacin no tiene Theme park managercomo fin reemplazar el consejo del mdico. Asegrese de hacerle al mdico cualquier pregunta que tenga. Document Released: 02/05/2005 Document Revised: 10/27/2014 Elsevier Interactive Patient Education  2017 ArvinMeritorElsevier Inc.

## 2016-05-03 NOTE — Addendum Note (Signed)
Addendum  created 05/03/16 [id number] by Moshe SalisburyKaren E Woodie Degraffenreid, CRNA   Sign clinical note

## 2016-05-03 NOTE — Progress Notes (Signed)
AVS reviewed with patient.  Prescription for pain medication given to patient.  Verbalized understanding of discharge instructions, Dr. Lovell SheehanJenkins office to call her with appointment time, and medicine.  IV removed.  Site WNL.  Patient transported by NT via w/c to main entrance for discharge.  Patient stable at time of discharge.

## 2016-05-03 NOTE — Discharge Summary (Signed)
Physician Discharge Summary  Patient ID: Eileen [name] MRN: [id number] DOB/AGE: [birth year] 35 y.o.  Admit date: 05/01/2016 Discharge date: 05/03/2016  Admission Diagnoses:Acute cholecystitis, cholelithiasis  Discharge Diagnoses: Same Active Problems:   Acute cholecystitis   Cholecystitis MRSA positive  Discharged Condition: good  Hospital Course: Patient is a 36 year old Hispanic female who presented to the emergency room with new onset right upper quadrant abdominal pain. She was found to have acute cholecystitis secondary to cholelithiasis. She was admitted to the hospital on 05/01/2016. She subsequently underwent a laparoscopic cholecystectomy on 05/02/2016. She tolerated the procedure well. Her postoperative course has been unremarkable. Her diet was advanced without difficulty. The patient is being discharged home on 05/03/2016 in good and improving condition.  Treatments: surgery: Laparoscopic cholecystectomy on 05/02/2016  Discharge Exam: Blood pressure 121/62, pulse 79, temperature 98.5 F (36.9 C), temperature source Oral, resp. rate 18, height 5\' 1"  (1.549 m), weight 193 lb (87.5 kg), last menstrual period 04/20/2016, SpO2 100 %, unknown if currently breastfeeding. General appearance: alert, cooperative and no distress Resp: clear to auscultation bilaterally Cardio: regular rate and rhythm, S1, S2 normal, no murmur, click, rub or gallop GI: Soft, incisions healing well.  Disposition: 01-Home or Self Care  Discharge Instructions    Diet - low sodium heart healthy    Complete by:  As directed    Increase activity slowly    Complete by:  As directed      Allergies as of 05/03/2016   No Known Allergies     Medication List    STOP taking these medications   HYDROcodone-acetaminophen 5-325 MG tablet Commonly known as:  NORCO/VICODIN     TAKE these medications   COLLAGEN PO Take by mouth. Takes 3 in the am   oxyCODONE-acetaminophen 7.5-325 MG  tablet Commonly known as:  PERCOCET Take 1-2 tablets by mouth every 4 (four) hours as needed.   prenatal vitamin w/FE, FA 27-1 MG Tabs tablet Take 1 tablet by mouth daily at 12 noon.      Follow-up Information    Franky MachoMark Phynix Horton, MD. Schedule an appointment as soon as possible for a visit on 05/08/2016.   Specialty:  General Surgery Why:  Call my office to make an appointment Contact information: [address] [place] [address] [phone]           Signed: Franky MachoMark Amya Hlad 05/03/2016, 8:03 AM

## 2016-05-03 NOTE — Anesthesia Postprocedure Evaluation (Signed)
Anesthesia Post Note  Patient: Eileen [name]  Procedure(s) Performed: Procedure(s) (LRB): LAPAROSCOPIC CHOLECYSTECTOMY (N/A)  Patient location during evaluation: Nursing Unit Anesthesia Type: General Level of consciousness: awake and alert and oriented Pain management: pain level controlled Vital Signs Assessment: post-procedure vital signs reviewed and stable Respiratory status: spontaneous breathing Cardiovascular status: stable Postop Assessment: no signs of nausea or vomiting and adequate PO intake Anesthetic complications: no     Last Vitals:  Vitals:   05/02/16 2148 05/03/16 0549  BP: 119/72 121/62  Pulse: 69 79  Resp: 18 18  Temp: 36.6 C 36.9 C    Last Pain:  Vitals:   05/03/16 0549  TempSrc: Oral  PainSc:                  Glynn OctaveANIEL,Ameenah Prosser

## 2016-05-04 ENCOUNTER — Ambulatory Visit (HOSPITAL_COMMUNITY): Admission: RE | Admit: 2016-05-04 | Payer: Self-pay | Source: Ambulatory Visit

## 2016-05-04 ENCOUNTER — Encounter (HOSPITAL_COMMUNITY): Payer: Self-pay | Admitting: General Surgery

## 2016-05-08 ENCOUNTER — Encounter: Payer: Self-pay | Admitting: General Surgery

## 2016-05-08 ENCOUNTER — Ambulatory Visit (INDEPENDENT_AMBULATORY_CARE_PROVIDER_SITE_OTHER): Payer: Self-pay | Admitting: General Surgery

## 2016-05-08 VITALS — BP 139/74 | HR 94 | Temp 98.0°F | Resp 18 | Ht 61.0 in | Wt 196.0 lb

## 2016-05-08 DIAGNOSIS — Z09 Encounter for follow-up examination after completed treatment for conditions other than malignant neoplasm: Secondary | ICD-10-CM

## 2016-05-08 MED ORDER — OXYCODONE-ACETAMINOPHEN 7.5-325 MG PO TABS: 1.0000 | ORAL_TABLET | ORAL | 0 refills | Status: DC | PRN

## 2016-05-08 NOTE — Progress Notes (Signed)
Subjective:     Eileen [name]  Status post laparoscopic cholecystectomy. She states her preoperative pain is gone. She does have incisional pain with movement. 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 She denies any fever or chills. Her nausea is resolved. Objective:    BP 139/74   Pulse 94   Temp 98 F (36.7 C)   Resp 18   Ht 5\' 1"  (1.549 m)   Wt 196 lb (88.9 kg)   LMP 04/20/2016 (Exact Date)   BMI 37.03 kg/m   General:  alert, cooperative and no distress  Abdomen soft, incisions healing well. Staples removed, Steri-Strips applied.     Assessment:    Postoperative course complicated by Incisional pain    Plan:  Percocet prescribed for pain. She was instructed to do no heavy lifting, but increase her activity as able. I will see her in 1 week for follow-up.

## 2016-05-15 ENCOUNTER — Encounter: Payer: Self-pay | Admitting: General Surgery

## 2016-05-15 ENCOUNTER — Ambulatory Visit (INDEPENDENT_AMBULATORY_CARE_PROVIDER_SITE_OTHER): Payer: Self-pay | Admitting: General Surgery

## 2016-05-15 VITALS — BP 138/72 | HR 77 | Temp 96.6°F | Resp 18 | Ht 61.0 in | Wt 196.0 lb

## 2016-05-15 DIAGNOSIS — Z09 Encounter for follow-up examination after completed treatment for conditions other than malignant neoplasm: Secondary | ICD-10-CM

## 2016-05-15 NOTE — Progress Notes (Signed)
Subjective:     Eileen [name] status post laparoscopic cholecystectomy on 05/02/2016. She is doing well. No nausea or vomiting have been noted. She has slight incisional pain when bending over. This is improving.  Objective:    BP 138/72   Pulse 77   Temp (!) 96.6 F (35.9 C)   Resp 18   Ht 5\' 1"  (1.549 m)   Wt 196 lb (88.9 kg)   LMP 04/20/2016 (Exact Date)   BMI 37.03 kg/m   General:  alert, cooperative and no distress  Abdomen is soft, nontender, nondistended. Incisions are healing well.     Assessment:    Doing well postoperatively.    Plan:Gradually increase activity. May return to work without restrictions on 05/21/2016.

## 2016-05-16 ENCOUNTER — Ambulatory Visit: Payer: Self-pay | Admitting: Adult Health

## 2016-08-27 ENCOUNTER — Encounter: Payer: Self-pay | Admitting: Adult Health

## 2016-08-27 ENCOUNTER — Ambulatory Visit (INDEPENDENT_AMBULATORY_CARE_PROVIDER_SITE_OTHER): Payer: Self-pay | Admitting: Adult Health

## 2016-08-27 VITALS — BP 126/72 | HR 72 | Ht 61.0 in | Wt 198.0 lb

## 2016-08-27 DIAGNOSIS — Z3490 Encounter for supervision of normal pregnancy, unspecified, unspecified trimester: Secondary | ICD-10-CM

## 2016-08-27 DIAGNOSIS — O3680X0 Pregnancy with inconclusive fetal viability, not applicable or unspecified: Secondary | ICD-10-CM

## 2016-08-27 DIAGNOSIS — O09519 Supervision of elderly primigravida, unspecified trimester: Secondary | ICD-10-CM

## 2016-08-27 DIAGNOSIS — Z3201 Encounter for pregnancy test, result positive: Secondary | ICD-10-CM

## 2016-08-27 DIAGNOSIS — N926 Irregular menstruation, unspecified: Secondary | ICD-10-CM

## 2016-08-27 LAB — POCT URINE PREGNANCY: Preg Test, Ur: POSITIVE — AB

## 2016-08-27 MED ORDER — PRENATAL PLUS 27-1 MG PO TABS: 1.0000 | ORAL_TABLET | Freq: Every day | ORAL | 12 refills | Status: DC

## 2016-08-27 NOTE — Progress Notes (Signed)
Subjective:     Patient ID: Eileen [name], female   DOB: 26-Feb-1980, 36 y.o.   MRN: [id number]  HPI Eileen [name] is a 36 year old Hispanic female in for UPT, has missed a period and has nausea. She had gallbladder removed in March, and had thigh abscess when had miscarriage in January.  Review of Systems +missed period  +nausea, no bleeding Reviewed past medical,surgical, social and family history. Reviewed medications and allergies.     Objective:   Physical Exam BP 126/72 (BP Location: Right Arm, Patient Position: Sitting, Cuff Size: Normal)   Pulse 72   Ht 5\' 1"  (1.549 m)   Wt 198 lb (89.8 kg)   LMP 07/21/2016 (Exact Date)   BMI 37.41 kg/m UPT +, about 5+2 weeks by LMP with EDD 04/28/17.  Skin warm and dry. Neck: mid line trachea, normal thyroid, good ROM, no lymphadenopathy noted. Lungs: clear to ausculation bilaterally. Cardiovascular: regular rate and rhythm.Abdomen is soft and non tender.    Assessment:     1. Positive pregnancy test   2. Pregnancy, unspecified gestational age   453. Advanced maternal age, primigravida, antepartum   4. Encounter to determine fetal viability of pregnancy, single or unspecified fetus       Plan:       Meds ordered this encounter  Medications  . prenatal vitamin w/FE, FA (PRENATAL 1 + 1) 27-1 MG TABS tablet    Sig: Take 1 tablet by mouth daily at 12 noon.    Dispense:  30 each    Refill:  12    Order Specific Question:   Supervising Provider    Answer:   Lazaro ArmsEURE, LUTHER H [[id number]]  Return in 2 weeks for dating US Go to DSS for pregnancy medicaid Handout by [facility] given

## 2016-09-03 ENCOUNTER — Emergency Department (HOSPITAL_COMMUNITY): Payer: Self-pay

## 2016-09-03 ENCOUNTER — Encounter (HOSPITAL_COMMUNITY): Payer: Self-pay | Admitting: Emergency Medicine

## 2016-09-03 ENCOUNTER — Emergency Department (HOSPITAL_COMMUNITY)
Admission: EM | Admit: 2016-09-03 | Discharge: 2016-09-04 | Disposition: A | Discharge status: Home / Self Care | Payer: Self-pay | Attending: Emergency Medicine | Admitting: Emergency Medicine

## 2016-09-03 DIAGNOSIS — Z3A01 Less than 8 weeks gestation of pregnancy: Secondary | ICD-10-CM | POA: Insufficient documentation

## 2016-09-03 DIAGNOSIS — O200 Threatened abortion: Secondary | ICD-10-CM | POA: Insufficient documentation

## 2016-09-03 LAB — CBC WITH DIFFERENTIAL/PLATELET
BASOS ABS: 0 10*3/uL (ref 0.0–0.1)
BASOS PCT: 0 %
EOS PCT: 1 %
Eosinophils Absolute: 0.1 10*3/uL (ref 0.0–0.7)
HCT: 41 % (ref 36.0–46.0)
Hemoglobin: 14.1 g/dL (ref 12.0–15.0)
LYMPHS PCT: 40 %
Lymphs Abs: 2.9 10*3/uL (ref 0.7–4.0)
MCH: 29.3 pg (ref 26.0–34.0)
MCHC: 34.4 g/dL (ref 30.0–36.0)
MCV: 85.2 fL (ref 78.0–100.0)
Monocytes Absolute: 0.4 10*3/uL (ref 0.1–1.0)
Monocytes Relative: 5 %
NEUTROS ABS: 3.8 10*3/uL (ref 1.7–7.7)
Neutrophils Relative %: 54 %
PLATELETS: 285 10*3/uL (ref 150–400)
RBC: 4.81 MIL/uL (ref 3.87–5.11)
RDW: 13.9 % (ref 11.5–15.5)
WBC: 7.2 10*3/uL (ref 4.0–10.5)

## 2016-09-03 LAB — ABO/RH: ABO/RH(D): A POS

## 2016-09-03 LAB — HCG, QUANTITATIVE, PREGNANCY: hCG, Beta Chain, Quant, S: 5843 m[IU]/mL — ABNORMAL HIGH (ref <5)

## 2016-09-03 NOTE — ED Notes (Signed)
No answer in lobby when called for a room. 

## 2016-09-03 NOTE — ED Triage Notes (Signed)
Pt states she is [redacted] weeks pregnant and starting having small amounts of vaginal bleeding today. Did not call dr Robinette Hainesfergusons office. Nad. C/o lower abd pain rating 7

## 2016-09-03 NOTE — ED Provider Notes (Signed)
[facility]PT Provider Note   CSN: [id number] Arrival date & time: 09/03/16  1638     History   Chief Complaint Chief Complaint  Patient presents with  . Abdominal Pain    [redacted] weeks pregnant    HPI Eileen [name] is a 36 y.o. female.  He presents for evaluation of lower abdominal pain which started today associated with a small amount of vaginal bleeding.  She is a first trimester pregnancy, with initial prenatal evaluation, without ultrasound imaging done yet.  Denies fever, vomiting, cough, chest pain, weakness or dizziness.  There is been no dysuria or vaginal discharge.  There are no other known modifying factors.  HPI  Past Medical History:  Diagnosis Date  . Abscess 02/29/2016  . Cyst of ovary, right 11/29/2015  . History of PCOS     Patient Active Problem List   Diagnosis Date Noted  . Pregnancy 08/27/2016  . Advanced maternal age, primigravida, antepartum 08/27/2016  . Positive pregnancy test 08/27/2016  . Acute cholecystitis 05/01/2016  . Cholecystitis 05/01/2016  . Spontaneous abortion in first trimester 03/12/2016  . Abscess 02/29/2016  . Cyst of ovary, right 11/29/2015    Past Surgical History:  Procedure Laterality Date  . CHOLECYSTECTOMY N/A 05/02/2016   Procedure: LAPAROSCOPIC CHOLECYSTECTOMY;  Surgeon: Franky MachoMark Jenkins, MD;  Location: [facility];  Service: General;  Laterality: N/A;  . INCISION AND DRAINAGE     right outer leg    OB History    Gravida Para Term Preterm AB Living   2 0 0 0 1 0   SAB TAB Ectopic Multiple Live Births   1 0 0 0 0       Home Medications    Prior to Admission medications   Medication Sig Start Date End Date Taking? Authorizing Provider  prenatal vitamin w/FE, FA (PRENATAL 1 + 1) 27-1 MG TABS tablet Take 1 tablet by mouth daily at 12 noon. 08/27/16  Yes Adline PotterGriffin, Jennifer A, NP    Family History Family History  Problem Relation Age of Onset  . Arthritis/Rheumatoid Father   . Diabetes Mother   . Diabetes  Sister     Social History Social History  Substance Use Topics  . Smoking status: Never Smoker  . Smokeless tobacco: Never Used  . Alcohol use No     Comment: sometimes; not now     Allergies   Patient has no known allergies.   Review of Systems Review of Systems  All other systems reviewed and are negative.    Physical Exam Updated Vital Signs BP 112/69 (BP Location: Left Arm)   Pulse 78   Temp 98.7 F (37.1 C) (Oral)   Resp 17   Ht 5\' 1"  (1.549 m)   Wt 89.8 kg (198 lb)   LMP 07/21/2016 (Exact Date)   SpO2 100%   BMI 37.41 kg/m   Physical Exam  Constitutional: She is oriented to person, place, and time. She appears well-developed and well-nourished.  HENT:  Head: Normocephalic and atraumatic.  Eyes: Pupils are equal, round, and reactive to light. Conjunctivae and EOM are normal.  Neck: Normal range of motion and phonation normal. Neck supple.  Cardiovascular: Normal rate and regular rhythm.   Pulmonary/Chest: Effort normal and breath sounds normal. She exhibits no tenderness.  Abdominal: Soft. She exhibits no distension. There is tenderness (Bilateral pelvic region, mild.). There is no rebound.  Musculoskeletal: Normal range of motion.  Neurological: She is alert and oriented to person, place, and time. She exhibits normal  muscle tone.  Skin: Skin is warm and dry.  Psychiatric: She has a normal mood and affect. Her behavior is normal. Judgment and thought content normal.  Nursing note and vitals reviewed.    ED Treatments / Results  Labs (all labs ordered are listed, but only abnormal results are displayed) Labs Reviewed  HCG, QUANTITATIVE, PREGNANCY - Abnormal; Notable for the following:       Result Value   hCG, Beta Chain, Quant, S 5,843 (*)    All other components within normal limits  CBC WITH DIFFERENTIAL/PLATELET  ABO/RH    EKG  EKG Interpretation None       Radiology US Ob Comp < 14 Wks  Result Date: 09/04/2016 CLINICAL DATA:   36 year old female with positive HCG levels presenting with pelvic pain. EXAM: OBSTETRIC <14 WK [place] AND TRANSVAGINAL OB US TECHNIQUE: Both transabdominal and transvaginal ultrasound examinations were performed for complete evaluation of the gestation as well as the maternal uterus, adnexal regions, and pelvic cul-de-sac. Transvaginal technique was performed to assess early pregnancy. COMPARISON:  None. FINDINGS: There is a single intrauterine cystic structure. No fetal pole or yolk sac identified within this structure. This may represent an early gestational sac, or a blighted ovum. A pseudo gestation of an ectopic pregnancy is not entirely excluded. If this cystic structure is a true gestational sac the estimated gestational age based on mean sac diameter of 7 mm is 5 weeks, 3 days. The endometrium is thickened. There is slight indented appearance of the fundal endometrium which may represent an arcuate or bicornuate morphology. No subchorionic hemorrhage identified. The right ovary measures 3.6 x 2.6 x 2.9 cm and the left ovary measures 4.7 x 2.1 x 1.9 cm. Color Doppler images demonstrated to both ovaries. There is a 2.4 x 1.6 x 1.5 cm cystic structure in the region of the right adnexa which may represent a paraovarian cyst low dilated portion of the right fallopian tube. IMPRESSION: 1. Single intrauterine cystic structure may represent an early gestational sac with an estimated gestational age of [redacted] weeks, 3 days. A blighted ovum or pseudo gestational of an ectopic pregnancy is not entirely excluded. Clinical correlation and follow-up with serial HCG level and repeat ultrasound in 7 days, or earlier if clinically indicated, recommended. 2. 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 3. Unremarkable ovaries. Right para ovarian cyst versus mildly dilated tube. Electronically Signed   By: Elgie Collard M.D.   On: 09/04/2016 00:17   US Ob Transvaginal  Result Date: 09/04/2016 CLINICAL DATA:  36 year old female with  positive HCG levels presenting with pelvic pain. EXAM: OBSTETRIC <14 WK [place] AND TRANSVAGINAL OB US TECHNIQUE: Both transabdominal and transvaginal ultrasound examinations were performed for complete evaluation of the gestation as well as the maternal uterus, adnexal regions, and pelvic cul-de-sac. Transvaginal technique was performed to assess early pregnancy. COMPARISON:  None. FINDINGS: There is a single intrauterine cystic structure. No fetal pole or yolk sac identified within this structure. This may represent an early gestational sac, or a blighted ovum. A pseudo gestation of an ectopic pregnancy is not entirely excluded. If this cystic structure is a true gestational sac the estimated gestational age based on mean sac diameter of 7 mm is 5 weeks, 3 days. The endometrium is thickened. There is slight indented appearance of the fundal endometrium which may represent an arcuate or bicornuate morphology. No subchorionic hemorrhage identified. The right ovary measures 3.6 x 2.6 x 2.9 cm and the left ovary measures 4.7 x 2.1 x 1.9  cm. Color Doppler images demonstrated to both ovaries. There is a 2.4 x 1.6 x 1.5 cm cystic structure in the region of the right adnexa which may represent a paraovarian cyst low dilated portion of the right fallopian tube. IMPRESSION: 1. Single intrauterine cystic structure may represent an early gestational sac with an estimated gestational age of [redacted] weeks, 3 days. A blighted ovum or pseudo gestational of an ectopic pregnancy is not entirely excluded. Clinical correlation and follow-up with serial HCG level and repeat ultrasound in 7 days, or earlier if clinically indicated, recommended. 2. 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 3. Unremarkable ovaries. Right para ovarian cyst versus mildly dilated tube. Electronically Signed   By: Elgie Collard M.D.   On: 09/04/2016 00:17    Procedures Procedures (including critical care time)  Medications Ordered in ED Medications - No data to  display   Initial Impression / Assessment and Plan / ED Course  I have reviewed the triage vital signs and the nursing notes.  Pertinent labs & imaging results that were available during my care of the patient were reviewed by me and considered in my medical decision making (see chart for details).      Patient Vitals for the past 24 hrs:  BP Temp Temp src Pulse Resp SpO2 Height Weight  09/03/16 2035 112/69 - - 78 17 100 % - -  09/03/16 1643 128/73 98.7 F (37.1 C) Oral 79 17 100 % 5\' 1"  (1.549 m) 89.8 kg (198 lb)    12:32 AM Reevaluation with update and discussion. After initial assessment and treatment, an updated evaluation reveals patient remains comfortable.  Findings discussed with the patient, and all questions were answered. Amanpreet Delmont L    Final Clinical Impressions(s) / ED Diagnoses   Final diagnoses:  Threatened abortion in early pregnancy   Vaginal bleeding, nonspecific.  Clinical evaluation is not consistent with ectopic pregnancy. Suspect that she has a threatened abortion, and that this pregnancy is not viable.  She is hemodynamically stable.  She has access to follow-up treatment.  Nursing Notes Reviewed/ Care Coordinated Applicable Imaging Reviewed Interpretation of Laboratory Data incorporated into ED treatment  The patient appears reasonably screened and/or stabilized for discharge and I doubt any other medical condition or other [facility] requiring further screening, evaluation, or treatment in the ED at this time prior to discharge.  Plan: Home Medications-OTC analgesia; Home Treatments-rest, fluids; return here if the recommended treatment, does not improve the symptoms; Recommended follow up-obstetrician follow-up 3 or 4 days, return here if needed. New Prescriptions New Prescriptions   No medications on file     Mancel Bale, MD 09/04/16 [phone]

## 2016-09-04 ENCOUNTER — Ambulatory Visit (INDEPENDENT_AMBULATORY_CARE_PROVIDER_SITE_OTHER): Payer: Self-pay | Admitting: Adult Health

## 2016-09-04 ENCOUNTER — Encounter: Payer: Self-pay | Admitting: Adult Health

## 2016-09-04 VITALS — BP 100/80 | HR 78 | Ht 61.0 in | Wt 201.4 lb

## 2016-09-04 DIAGNOSIS — O200 Threatened abortion: Secondary | ICD-10-CM

## 2016-09-04 DIAGNOSIS — N920 Excessive and frequent menstruation with regular cycle: Secondary | ICD-10-CM

## 2016-09-04 DIAGNOSIS — N939 Abnormal uterine and vaginal bleeding, unspecified: Secondary | ICD-10-CM

## 2016-09-04 NOTE — Progress Notes (Signed)
Subjective:     Patient ID: Eileen [name], female   DOB: 1980-10-27, 36 y.o.   MRN: [id number]  HPI [name] is a 36 year old Hispanic female in for follow up of being seen in ER yesterday for abdominal pain and bleeding, her QHCG was 5,843 and blood type is A+. Ultrasound showed: There is a single intrauterine cystic structure. No fetal pole or yolk sac identified within this structure. This may represent an early gestational sac, or a blighted ovum. A pseudo gestation of an ectopic pregnancy is not entirely excluded. If this cystic structure is a true gestational sac the estimated gestational age based on mean sac diameter of 7 mm is 5 weeks, 3 days.  The endometrium is thickened. There is slight indented appearance of the fundal endometrium which may represent an arcuate or bicornuate morphology.  No subchorionic hemorrhage identified.  The right ovary measures 3.6 x 2.6 x 2.9 cm and the left ovary measures 4.7 x 2.1 x 1.9 cm. Color Doppler images demonstrated to both ovaries. There is a 2.4 x 1.6 x 1.5 cm cystic structure in the region of the right adnexa which may represent a paraovarian cyst low dilated portion of the right fallopian tube.   Review of Systems Spotting tan now Some low pelvic discomfort  Reviewed past medical,surgical, social and family history. Reviewed medications and allergies.     Objective:   Physical Exam BP 100/80 (BP Location: Left Arm, Patient Position: Sitting, Cuff Size: Small)   Pulse 78   Ht 5\' 1"  (1.549 m)   Wt 201 lb 6.4 oz (91.4 kg)   LMP 07/21/2016 (Exact Date)   BMI 38.05 kg/m   Skin warm and dry.Pelvic: external genitalia is normal in appearance no lesions, vagina: tan discharge, no odor,urethra has no lesions or masses noted, cervix:smooth and bulbous, uterus: normal size, shape and contour, mildly tender, no masses felt, adnexa: no masses or tenderness noted. Bladder is non tender and no masses felt.     Assessment:      1. Vaginal spotting   2. Threatened abortion in early pregnancy       Plan:     [facility] tomorrow afternoon or Thursday am No lifting over 25 lbs and no sex Follow up Monday as scheduled for US  Call with any questions or concerns

## 2016-09-04 NOTE — Patient Instructions (Signed)
No lifting over 25 lbs No sex

## 2016-09-04 NOTE — Discharge Instructions (Signed)
Get plenty of rest and drink a lot of fluids  It is important to follow-up with the obstetrician, in 3 or 4 days

## 2016-09-07 LAB — BETA HCG QUANT (REF LAB): HCG QUANT: 8419 m[IU]/mL

## 2016-09-08 ENCOUNTER — Encounter (HOSPITAL_COMMUNITY): Payer: Self-pay

## 2016-09-08 ENCOUNTER — Emergency Department (HOSPITAL_COMMUNITY)
Admission: EM | Admit: 2016-09-08 | Discharge: 2016-09-08 | Disposition: A | Discharge status: Home / Self Care | Payer: Self-pay | Attending: Emergency Medicine | Admitting: Emergency Medicine

## 2016-09-08 ENCOUNTER — Emergency Department (HOSPITAL_COMMUNITY): Payer: Self-pay

## 2016-09-08 DIAGNOSIS — R1084 Generalized abdominal pain: Secondary | ICD-10-CM | POA: Insufficient documentation

## 2016-09-08 DIAGNOSIS — O4690 Antepartum hemorrhage, unspecified, unspecified trimester: Secondary | ICD-10-CM

## 2016-09-08 DIAGNOSIS — R52 Pain, unspecified: Secondary | ICD-10-CM

## 2016-09-08 DIAGNOSIS — O209 Hemorrhage in early pregnancy, unspecified: Secondary | ICD-10-CM | POA: Insufficient documentation

## 2016-09-08 DIAGNOSIS — O4691 Antepartum hemorrhage, unspecified, first trimester: Secondary | ICD-10-CM | POA: Insufficient documentation

## 2016-09-08 DIAGNOSIS — Z3A01 Less than 8 weeks gestation of pregnancy: Secondary | ICD-10-CM | POA: Insufficient documentation

## 2016-09-08 LAB — CBC WITH DIFFERENTIAL/PLATELET
BASOS ABS: 0 10*3/uL (ref 0.0–0.1)
BASOS PCT: 0 %
EOS ABS: 0 10*3/uL (ref 0.0–0.7)
Eosinophils Relative: 1 %
HCT: 39.9 % (ref 36.0–46.0)
HEMOGLOBIN: 13.6 g/dL (ref 12.0–15.0)
Lymphocytes Relative: 35 %
Lymphs Abs: 1.9 10*3/uL (ref 0.7–4.0)
MCH: 29.2 pg (ref 26.0–34.0)
MCHC: 34.1 g/dL (ref 30.0–36.0)
MCV: 85.6 fL (ref 78.0–100.0)
Monocytes Absolute: 0.3 10*3/uL (ref 0.1–1.0)
Monocytes Relative: 6 %
Neutro Abs: 3.2 10*3/uL (ref 1.7–7.7)
Neutrophils Relative %: 58 %
Platelets: 231 10*3/uL (ref 150–400)
RBC: 4.66 MIL/uL (ref 3.87–5.11)
RDW: 13.9 % (ref 11.5–15.5)
WBC: 5.5 10*3/uL (ref 4.0–10.5)

## 2016-09-08 LAB — I-STAT CHEM 8, ED
BUN: 10 mg/dL (ref 6–20)
CHLORIDE: 102 mmol/L (ref 101–111)
Calcium, Ion: 1.22 mmol/L (ref 1.15–1.40)
Creatinine, Ser: 0.6 mg/dL (ref 0.44–1.00)
Glucose, Bld: 91 mg/dL (ref 65–99)
HEMATOCRIT: 40 % (ref 36.0–46.0)
Hemoglobin: 13.6 g/dL (ref 12.0–15.0)
POTASSIUM: 3.9 mmol/L (ref 3.5–5.1)
SODIUM: 139 mmol/L (ref 135–145)
TCO2: 24 mmol/L (ref 0–100)

## 2016-09-08 LAB — URINALYSIS, ROUTINE W REFLEX MICROSCOPIC
Bilirubin Urine: NEGATIVE
Glucose, UA: NEGATIVE mg/dL
Hgb urine dipstick: NEGATIVE
Ketones, ur: NEGATIVE mg/dL
Leukocytes, UA: NEGATIVE
Nitrite: NEGATIVE
PROTEIN: NEGATIVE mg/dL
Specific Gravity, Urine: 1.013 (ref 1.005–1.030)
pH: 8 (ref 5.0–8.0)

## 2016-09-08 LAB — WET PREP, GENITAL
Clue Cells Wet Prep HPF POC: NONE SEEN
SPERM: NONE SEEN
TRICH WET PREP: NONE SEEN
YEAST WET PREP: NONE SEEN

## 2016-09-08 LAB — HCG, QUANTITATIVE, PREGNANCY: hCG, Beta Chain, Quant, S: 14360 m[IU]/mL — ABNORMAL HIGH (ref <5)

## 2016-09-08 MED ORDER — ACETAMINOPHEN 325 MG PO TABS
650.0000 mg | ORAL_TABLET | Freq: Once | ORAL | Status: DC
Start: 2016-09-08 — End: 2016-09-08

## 2016-09-08 NOTE — ED Provider Notes (Signed)
[facility]PT Provider Note   CSN: [id number] Arrival date & time: 09/08/16  0815     History   Chief Complaint Chief Complaint  Patient presents with  . Vaginal Bleeding    HPI Eileen [name] is a 36 y.o. female.  HPI Eileen [name] is a 36 y.o. female with history of polycystic ovarian syndrome, presents to emergency department complaining of vaginal bleeding during pregnancy. Patient is a G2 P0, currently [redacted] weeks gestation. States that she started spotting 5 days ago. She was seen in emergency department that time, she was told that she might have a miscarriage. She followed up with her OB/GYN in next day, who reassessed her, again told her she may be having a miscarriage, scheduled her for repeat ultrasound in 2 days. She states that her pain has been improving, however her bleeding is persistent, which is why she came to the ED today. States pain is in the lower abdomen, more on the left side. Denies changes in bowels. No urinary symptoms. No fever, chills, malaise. Hx of miscarriage at 5 wks.     Past Medical History:  Diagnosis Date  . Abscess 02/29/2016  . Cyst of ovary, right 11/29/2015  . History of PCOS     Patient Active Problem List   Diagnosis Date Noted  . Vaginal spotting 09/04/2016  . Threatened abortion in early pregnancy 09/04/2016  . Pregnancy 08/27/2016  . Advanced maternal age, primigravida, antepartum 08/27/2016  . Positive pregnancy test 08/27/2016  . Acute cholecystitis 05/01/2016  . Cholecystitis 05/01/2016  . Spontaneous abortion in first trimester 03/12/2016  . Abscess 02/29/2016  . Cyst of ovary, right 11/29/2015    Past Surgical History:  Procedure Laterality Date  . CHOLECYSTECTOMY N/A 05/02/2016   Procedure: LAPAROSCOPIC CHOLECYSTECTOMY;  Surgeon: Franky Macho, MD;  Location: [facility];  Service: General;  Laterality: N/A;  . INCISION AND DRAINAGE     right outer leg    OB History    Gravida Para Term Preterm AB  Living   2 0 0 0 1 0   SAB TAB Ectopic Multiple Live Births   1 0 0 0 0       Home Medications    Prior to Admission medications   Medication Sig Start Date End Date Taking? Authorizing Provider  prenatal vitamin w/FE, FA (PRENATAL 1 + 1) 27-1 MG TABS tablet Take 1 tablet by mouth daily at 12 noon. 08/27/16  Yes Adline Potter, NP    Family History Family History  Problem Relation Age of Onset  . Arthritis/Rheumatoid Father   . Diabetes Mother   . Diabetes Sister     Social History Social History  Substance Use Topics  . Smoking status: Never Smoker  . Smokeless tobacco: Never Used  . Alcohol use No     Comment: sometimes; not now     Allergies   Patient has no known allergies.   Review of Systems Review of Systems  Constitutional: Negative for chills and fever.  Respiratory: Negative for cough, chest tightness and shortness of breath.   Cardiovascular: Negative for chest pain, palpitations and leg swelling.  Gastrointestinal: Positive for abdominal pain. Negative for diarrhea, nausea and vomiting.  Genitourinary: Positive for pelvic pain and vaginal bleeding. Negative for dysuria, flank pain, vaginal discharge and vaginal pain.  Musculoskeletal: Negative for arthralgias, myalgias, neck pain and neck stiffness.  Skin: Negative for rash.  Neurological: Negative for dizziness, weakness and headaches.  All other systems reviewed and are negative.  Physical Exam Updated Vital Signs BP 109/67 (BP Location: Right Arm)   Pulse 69   Temp 98.8 F (37.1 C) (Oral)   Resp 14   Ht 5\' 1"  (1.549 m)   Wt 91.2 kg (201 lb)   LMP 07/21/2016 (Exact Date)   SpO2 100%   BMI 37.98 kg/m   Physical Exam  Constitutional: She appears well-developed and well-nourished. No distress.  HENT:  Head: Normocephalic.  Eyes: Conjunctivae are normal.  Neck: Neck supple.  Cardiovascular: Normal rate, regular rhythm and normal heart sounds.   Pulmonary/Chest: Effort normal and  breath sounds normal. No respiratory distress. She has no wheezes. She has no rales.  Abdominal: Soft. Bowel sounds are normal. She exhibits no distension. There is no tenderness. There is no rebound.  Genitourinary:  Genitourinary Comments: Normal external genitalia. Normal vaginal canal. Small thin brown/bloody discharge. Cervix is normal, closed. No CMT. No uterine or adnexal tenderness. No masses palpated.    Musculoskeletal: She exhibits no edema.  Neurological: She is alert.  Skin: Skin is warm and dry.  Psychiatric: She has a normal mood and affect. Her behavior is normal.  Nursing note and vitals reviewed.    ED Treatments / Results  Labs (all labs ordered are listed, but only abnormal results are displayed) Labs Reviewed  WET PREP, GENITAL - Abnormal; Notable for the following:       Result Value   WBC, Wet Prep HPF POC MODERATE (*)    All other components within normal limits  HCG, QUANTITATIVE, PREGNANCY - Abnormal; Notable for the following:    hCG, Beta Chain, Quant, S 14,360 (*)    All other components within normal limits  URINALYSIS, ROUTINE W REFLEX MICROSCOPIC - Abnormal; Notable for the following:    APPearance CLOUDY (*)    All other components within normal limits  CBC WITH DIFFERENTIAL/PLATELET  I-STAT CHEM 8, ED  GC/CHLAMYDIA PROBE AMP ([place]) NOT AT [facility]    EKG  EKG Interpretation None       Radiology [place] Ob Comp Less 14 Wks  Result Date: 09/08/2016 CLINICAL DATA:  Seven weeks pregnant.  Vaginal bleeding. EXAM: OBSTETRIC <14 WK US AND TRANSVAGINAL OB US TECHNIQUE: Both transabdominal and transvaginal ultrasound examinations were performed for complete evaluation of the gestation as well as the maternal uterus, adnexal regions, and pelvic cul-de-sac. Transvaginal technique was performed to assess early pregnancy. COMPARISON:  09/03/2016 FINDINGS: Intrauterine gestational sac: Single Yolk sac:  Visualized. Embryo: Yolk sac has a signet ring  appearance on image 32 and 28, but a definite embryo is not visible. The interval between follow-up scans is less than 7 days. MSD: 12.9  mm   6 w   1  d - progressed in size as expected Subchorionic hemorrhage:  None visualized. Maternal uterus/adnexae: Left ovary is not seen. Physiologic appearance of the right ovary. No adnexal mass or pelvic fluid. IMPRESSION: 1. Intrauterine gestational sac measuring 6 weeks 1 day, with newly seen yolk sac. An early embryo could be present but is not definitive. Recommend follow-up to assess viability. 2. No subchorionic hematoma or other acute finding. Electronically Signed   By: Marnee SpringJonathon  Watts M.D.   On: 09/08/2016 10:26   [place] Ob Transvaginal  Result Date: 09/08/2016 CLINICAL DATA:  Seven weeks pregnant.  Vaginal bleeding. EXAM: OBSTETRIC <14 WK US AND TRANSVAGINAL OB US TECHNIQUE: Both transabdominal and transvaginal ultrasound examinations were performed for complete evaluation of the gestation as well as the maternal uterus, adnexal regions, and pelvic  cul-de-sac. Transvaginal technique was performed to assess early pregnancy. COMPARISON:  09/03/2016 FINDINGS: Intrauterine gestational sac: Single Yolk sac:  Visualized. Embryo: Yolk sac has a signet ring appearance on image 32 and 28, but a definite embryo is not visible. The interval between follow-up scans is less than 7 days. MSD: 12.9  mm   6 w   1  d - progressed in size as expected Subchorionic hemorrhage:  None visualized. Maternal uterus/adnexae: Left ovary is not seen. Physiologic appearance of the right ovary. No adnexal mass or pelvic fluid. IMPRESSION: 1. Intrauterine gestational sac measuring 6 weeks 1 day, with newly seen yolk sac. An early embryo could be present but is not definitive. Recommend follow-up to assess viability. 2. No subchorionic hematoma or other acute finding. Electronically Signed   By: Marnee Spring M.D.   On: 09/08/2016 10:26    Procedures Procedures (including critical care  time)  Medications Ordered in ED Medications - No data to display   Initial Impression / Assessment and Plan / ED Course  I have reviewed the triage vital signs and the nursing notes.  Pertinent labs & imaging results that were available during my care of the patient were reviewed by me and considered in my medical decision making (see chart for details).     Pt with recent diagnosis of Threatened abortion. Patient is   first trimester pregnancy with vaginal bleeding and cramping. Her recent ultrasound showed single intrauterine cystic structure, could represent early gestational sac. Will check her exam, basic labs including hCG, we'll perform another ultrasound.  12:12 PM Patient's hCG is up appropriately. Her ultrasound today showing intrauterine gestational sac, now measuring at 6 weeks 1 day with a newly seen yolk sac. Early embryo still not visualized, however could be early. No other acute findings and ultrasound. Will have patient follow-up with her OB/GYN early next week. Results discussed. Patient encouraged to have pelvic rest, take Tylenol for cramping, follow up as instructed. Return precautions discussed.  Vitals:   09/08/16 0823 09/08/16 0825 09/08/16 0900 09/08/16 1130  BP: 109/67  113/64 (!) 107/55  Pulse: 69  75 70  Resp: 14   17  Temp: 98.8 F (37.1 C)   99.1 F (37.3 C)  TempSrc: Oral   Oral  SpO2: 100%  98% 100%  Weight:  91.2 kg (201 lb)  91.2 kg (201 lb)  Height:  5\' 1"  (1.549 m)  5\' 1"  (1.549 m)    Final Clinical Impressions(s) / ED Diagnoses   Final diagnoses:  Vaginal bleeding in pregnancy    New Prescriptions New Prescriptions   No medications on file     Jaynie Crumble, PA-C 09/08/16 1216    Little, Ambrose Finland, MD 09/09/16 [phone]

## 2016-09-08 NOTE — ED Notes (Signed)
Patient transported to Ultrasound 

## 2016-09-08 NOTE — ED Notes (Signed)
Pt. Ambulated to restroom independently with steady gait

## 2016-09-08 NOTE — Discharge Instructions (Signed)
Take Tylenol for cramping. Follow-up with your OB/GYN early next week for reassessment. If develops severe pain or heavy bleeding[facility]

## 2016-09-08 NOTE — ED Triage Notes (Signed)
Pt. Here for increased vaginal bleeding and cramping. Pt. + pregnancy test. Pt. Seen and had ultrasound a few days ago. Pt. States the ultrasound was uncertain. Pt. Hx of PCOS. LMP 07-21-16.

## 2016-09-10 ENCOUNTER — Telehealth: Payer: Self-pay | Admitting: Adult Health

## 2016-09-10 ENCOUNTER — Ambulatory Visit (INDEPENDENT_AMBULATORY_CARE_PROVIDER_SITE_OTHER): Payer: Medicaid Other

## 2016-09-10 DIAGNOSIS — O3680X0 Pregnancy with inconclusive fetal viability, not applicable or unspecified: Secondary | ICD-10-CM

## 2016-09-10 LAB — GC/CHLAMYDIA PROBE AMP (~~LOC~~) NOT AT ARMC
CHLAMYDIA, DNA PROBE: NEGATIVE
Neisseria Gonorrhea: NEGATIVE

## 2016-09-10 NOTE — Telephone Encounter (Signed)
[place] stopped by after US has IUP with fetal pole with +FHM, she requested note for work, stating no lifting greater than 25 lbs, note given.

## 2016-09-10 NOTE — Progress Notes (Signed)
US 7+2 wks,single IUP w/ys,positive fht 117 bpm,normal ovaries bilat,simple right paraovarian cyst 2.5 x 2.2 x 1.6 cm,EDD 04/27/2017

## 2016-09-22 ENCOUNTER — Encounter (HOSPITAL_COMMUNITY): Payer: Self-pay | Admitting: Emergency Medicine

## 2016-09-22 DIAGNOSIS — Z3A09 9 weeks gestation of pregnancy: Secondary | ICD-10-CM | POA: Insufficient documentation

## 2016-09-22 DIAGNOSIS — O200 Threatened abortion: Secondary | ICD-10-CM | POA: Insufficient documentation

## 2016-09-22 LAB — I-STAT BETA HCG BLOOD, ED (MC, WL, AP ONLY): I-stat hCG, quantitative: >2000 m[IU]/mL — ABNORMAL HIGH (ref <5)

## 2016-09-22 NOTE — ED Triage Notes (Signed)
Pt presents with lower abd pain and vaginal bleeding x 2 hrs, pt reports being 2 months pregnant  (G2P0) denies any abd trauma; pt reports first pregancy resulted in miscarriage; EDD 04/2017

## 2016-09-23 ENCOUNTER — Emergency Department (HOSPITAL_COMMUNITY)
Admission: EM | Admit: 2016-09-23 | Discharge: 2016-09-23 | Disposition: A | Discharge status: Home / Self Care | Payer: Self-pay | Attending: Emergency Medicine | Admitting: Emergency Medicine

## 2016-09-23 DIAGNOSIS — O200 Threatened abortion: Secondary | ICD-10-CM

## 2016-09-23 NOTE — Discharge Instructions (Signed)
Please read attached information. If you experience any new or worsening signs or symptoms please go to the women's emergency room immediately. Please call your OB/GYN and schedule follow-up evaluation on Monday. Use Tylenol as needed for pain.

## 2016-09-23 NOTE — ED Provider Notes (Signed)
[facility] Provider Note   CSN: [id number] Arrival date & time: 09/22/16  2133     History   Chief Complaint Chief Complaint  Patient presents with  . Possible Pregnancy  . Abdominal Pain  . Vaginal Bleeding    HPI Eileen [name] is a 36 y.o. female.  HPI   Translator used  36 year old female presents today with complaints of pelvic pain and vaginal bleeding. Patient is proximally 9 weeks by LMP here today. Patient has a history of miscarriage in the past. She had bleeding early on in her pregnancy that resolved. Patient notes symptoms started approximately 4 hours prior to arrival to the emergency room with lower pelvic pain and small amount of bleeding. Patient reports that she was using a pad, the blood did not soak through the pad. She denies any complaints of dizziness, or fatigue, no other systemic symptoms. Patient reports morning nausea and vomiting.   She was seen at the end of July with intrauterine pregnancy, she has a positive blood. She had STD testing at that time which showed no findings.     Past Medical History:  Diagnosis Date  . Abscess 02/29/2016  . Cyst of ovary, right 11/29/2015  . History of PCOS     Patient Active Problem List   Diagnosis Date Noted  . Vaginal spotting 09/04/2016  . Threatened abortion in early pregnancy 09/04/2016  . Pregnancy 08/27/2016  . Advanced maternal age, primigravida, antepartum 08/27/2016  . Positive pregnancy test 08/27/2016  . Acute cholecystitis 05/01/2016  . Cholecystitis 05/01/2016  . Spontaneous abortion in first trimester 03/12/2016  . Abscess 02/29/2016  . Cyst of ovary, right 11/29/2015    Past Surgical History:  Procedure Laterality Date  . CHOLECYSTECTOMY N/A 05/02/2016   Procedure: LAPAROSCOPIC CHOLECYSTECTOMY;  Surgeon: Franky MachoMark Jenkins, MD;  Location: [facility];  Service: General;  Laterality: N/A;  . INCISION AND DRAINAGE     right outer leg    OB History    Gravida Para Term Preterm  AB Living   2 0 0 0 1 0   SAB TAB Ectopic Multiple Live Births   1 0 0 0 0       Home Medications    Prior to Admission medications   Medication Sig Start Date End Date Taking? Authorizing Provider  prenatal vitamin w/FE, FA (PRENATAL 1 + 1) 27-1 MG TABS tablet Take 1 tablet by mouth daily at 12 noon. 08/27/16   Adline PotterGriffin, Jennifer A, NP    Family History Family History  Problem Relation Age of Onset  . Arthritis/Rheumatoid Father   . Diabetes Mother   . Diabetes Sister     Social History Social History  Substance Use Topics  . Smoking status: Never Smoker  . Smokeless tobacco: Never Used  . Alcohol use No     Comment: sometimes; not now     Allergies   Patient has no known allergies.   Review of Systems Review of Systems  All other systems reviewed and are negative.  Physical Exam Updated Vital Signs BP 117/63 (BP Location: Left Arm)   Pulse 78   Temp 98 F (36.7 C) (Oral)   Resp 15   LMP 07/21/2016 (Exact Date)   SpO2 98%   Physical Exam  Constitutional: She is oriented to person, place, and time. She appears well-developed and well-nourished.  HENT:  Head: Normocephalic and atraumatic.  Eyes: Pupils are equal, round, and reactive to light. Conjunctivae are normal. Right eye exhibits no discharge. Left eye  exhibits no discharge. No scleral icterus.  Neck: Normal range of motion. No JVD present. No tracheal deviation present.  Pulmonary/Chest: Effort normal. No stridor.  Abdominal:  Tenderness to palpation of the suprapubic region; remainder of abdominal exam benign  Genitourinary:  Genitourinary Comments: Small amount of blood noted in the vaginal vault, cervical os closed  Neurological: She is alert and oriented to person, place, and time. Coordination normal.  Psychiatric: She has a normal mood and affect. Her behavior is normal. Judgment and thought content normal.  Nursing note and vitals reviewed.    ED Treatments / Results  Labs (all labs  ordered are listed, but only abnormal results are displayed) Labs Reviewed  I-STAT BETA HCG BLOOD, ED ([facility], [facility] ONLY) - Abnormal; Notable for the following:       Result Value   I-stat hCG, quantitative >2,000.0 (*)    All other components within normal limits    EKG  EKG Interpretation None       Radiology No results found.  Procedures Procedures (including critical care time)  Medications Ordered in ED Medications - No data to display   Initial Impression / Assessment and Plan / ED Course  I have reviewed the triage vital signs and the nursing notes.  Pertinent labs & imaging results that were available during my care of the patient were reviewed by me and considered in my medical decision making (see chart for details).      Final Clinical Impressions(s) / ED Diagnoses   Final diagnoses:  Threatened miscarriage     Assessment/Plan: 36 year old female presents today with threatened miscarriage. She has a closed cervical os, very small amount of blood loss. She has no systemic signs or symptoms consistent with severe blood loss. Patient is a positive blood, no need for RhoGAM here. Patient's pain is not severe enough that she needs any medication per patient. Patient is nonviable at this stage with ARD diagnosed intrauterine pregnancy. No need for ultrasound here. Patient will follow-up as an outpatient with OB/GYN, she was given strict follow-up information so that she would do to the emergency room women's if any new or worsening signs or symptoms presented. Patient relates understanding and agreement to this plan and no further questions and started on discharge.      New Prescriptions Discharge Medication List as of 09/23/2016  2:59 AM       Eyvonne MechanicHedges, Kaiana Marion, PA-C 09/23/16 [phone]    Zadie RhineWickline, Donald, MD 09/24/16 [phone]

## 2016-09-24 ENCOUNTER — Other Ambulatory Visit: Payer: Medicaid Other

## 2016-09-24 ENCOUNTER — Telehealth: Payer: Self-pay | Admitting: *Deleted

## 2016-09-24 DIAGNOSIS — O200 Threatened abortion: Secondary | ICD-10-CM

## 2016-09-24 NOTE — Telephone Encounter (Signed)
Pt came into office today stating that she went to the ED last night for bleeding. She states they told her she needed to follow up at the office today. Advised pt, per Joellyn HaffKim Booker, CNM, that we would have her get an Hcg level at [facility] today and another on 8/8. Advised pt to keep her appt on 8/8 with a provider. Advised pt to call us back if she experienced severe abdominal cramping or increased bleeding or with any other concerns. Pt verbalized understanding.

## 2016-09-25 LAB — BETA HCG QUANT (REF LAB): hCG Quant: 55513 m[IU]/mL

## 2016-09-26 ENCOUNTER — Encounter: Payer: Self-pay | Admitting: Women's Health

## 2016-09-26 ENCOUNTER — Ambulatory Visit (INDEPENDENT_AMBULATORY_CARE_PROVIDER_SITE_OTHER): Payer: Medicaid Other | Admitting: Women's Health

## 2016-09-26 ENCOUNTER — Other Ambulatory Visit: Payer: Self-pay

## 2016-09-26 VITALS — BP 106/60 | HR 64 | Wt 208.0 lb

## 2016-09-26 DIAGNOSIS — Z3682 Encounter for antenatal screening for nuchal translucency: Secondary | ICD-10-CM | POA: Diagnosis not present

## 2016-09-26 DIAGNOSIS — O09529 Supervision of elderly multigravida, unspecified trimester: Secondary | ICD-10-CM | POA: Insufficient documentation

## 2016-09-26 DIAGNOSIS — Z331 Pregnant state, incidental: Secondary | ICD-10-CM

## 2016-09-26 DIAGNOSIS — Z3A09 9 weeks gestation of pregnancy: Secondary | ICD-10-CM

## 2016-09-26 DIAGNOSIS — O0990 Supervision of high risk pregnancy, unspecified, unspecified trimester: Secondary | ICD-10-CM | POA: Insufficient documentation

## 2016-09-26 DIAGNOSIS — Z1389 Encounter for screening for other disorder: Secondary | ICD-10-CM | POA: Diagnosis not present

## 2016-09-26 DIAGNOSIS — Z3481 Encounter for supervision of other normal pregnancy, first trimester: Secondary | ICD-10-CM

## 2016-09-26 DIAGNOSIS — O09521 Supervision of elderly multigravida, first trimester: Secondary | ICD-10-CM

## 2016-09-26 LAB — POCT URINALYSIS DIPSTICK
Glucose, UA: NEGATIVE
KETONES UA: NEGATIVE
Nitrite, UA: NEGATIVE
PROTEIN UA: NEGATIVE

## 2016-09-26 LAB — OB RESULTS CONSOLE HIV ANTIBODY (ROUTINE TESTING): HIV: NONREACTIVE

## 2016-09-26 LAB — OB RESULTS CONSOLE GC/CHLAMYDIA
CHLAMYDIA, DNA PROBE: NEGATIVE
Gonorrhea: NEGATIVE

## 2016-09-26 LAB — OB RESULTS CONSOLE RPR: RPR: NONREACTIVE

## 2016-09-26 LAB — OB RESULTS CONSOLE RUBELLA ANTIBODY, IGM: RUBELLA: IMMUNE

## 2016-09-26 LAB — OB RESULTS CONSOLE VARICELLA ZOSTER ANTIBODY, IGG: VARICELLA IGG: IMMUNE

## 2016-09-26 LAB — OB RESULTS CONSOLE GBS: GBS: POSITIVE

## 2016-09-26 MED ORDER — DOXYLAMINE-PYRIDOXINE ER 20-20 MG PO TBCR: 1.0000 | EXTENDED_RELEASE_TABLET | Freq: Every day | ORAL | 8 refills | Status: DC

## 2016-09-26 NOTE — Patient Instructions (Signed)
Primer trimestre de embarazo (First Trimester of Pregnancy) El primer trimestre de embarazo se extiende desde la semana1 hasta el final de la semana12 (mes1 al mes3). Una semana despus de que un espermatozoide fecunda un vulo, este se implantar en la pared uterina. Este embrin comenzar a desarrollarse hasta convertirse en un beb. Sus genes y los de su pareja forman el beb. Los genes del varn determinan si ser un nio o una nia. Entre la semana6 y la8, se forman los ojos y el rostro, y los latidos del corazn pueden verse en la ecografa. Al final de las 12semanas, todos los rganos del beb estn formados. Ahora que est embarazada, querr hacer todo lo que est a su alcance para tener un beb sano. Dos de las cosas ms importantes son tener una buena atencin prenatal y seguir las indicaciones del mdico. La atencin prenatal incluye toda la asistencia mdica que usted recibe antes del nacimiento del beb. Esta ayudar a prevenir, detectar y tratar cualquier problema durante el embarazo y el parto. CAMBIOS EN EL ORGANISMO Su organismo atraviesa por muchos cambios durante el embarazo, y estos varan de una mujer a otra.  Al principio, puede aumentar o bajar algunos kilos.  Puede tener malestar estomacal (nuseas) y vomitar. Si no puede controlar los vmitos, llame al mdico.  Puede cansarse con facilidad.  Es posible que tenga dolores de cabeza que pueden aliviarse con los medicamentos que el mdico le permita tomar.  Puede orinar con mayor frecuencia. El dolor al orinar puede significar que usted tiene una infeccin de la vejiga.  Debido al embarazo, puede tener acidez estomacal.  Puede estar estreida, ya que ciertas hormonas enlentecen los movimientos de los msculos que empujan los desechos a travs de los intestinos.  Pueden aparecer hemorroides o abultarse e hincharse las venas (venas varicosas).  Las mamas pueden empezar a agrandarse y estar sensibles. Los pezones  pueden sobresalir ms, y el tejido que los rodea (areola) tornarse ms oscuro.  Las encas pueden sangrar y estar sensibles al cepillado y al hilo dental.  Pueden aparecer zonas oscuras o manchas (cloasma, mscara del embarazo) en el rostro que probablemente se atenuarn despus del nacimiento del beb.  Los perodos menstruales se interrumpirn.  Tal vez no tenga apetito.  Puede sentir un fuerte deseo de consumir ciertos alimentos.  Puede tener cambios a nivel emocional da a da, por ejemplo, por momentos puede estar emocionada por el embarazo y por otros preocuparse porque algo pueda salir mal con el embarazo o el beb.  Tendr sueos ms vvidos y extraos.  Tal vez haya cambios en el cabello que pueden incluir su engrosamiento, crecimiento rpido y cambios en la textura. A algunas mujeres tambin se les cae el cabello durante o despus del embarazo, o tienen el cabello seco o fino. Lo ms probable es que el cabello se le normalice despus del nacimiento del beb. QU DEBE ESPERAR EN LAS CONSULTAS PRENATALES Durante una visita prenatal de rutina:  La pesarn para asegurarse de que usted y el beb estn creciendo normalmente.  Le controlarn la presin arterial.  Le medirn el abdomen para controlar el desarrollo del beb.  Se escucharn los latidos cardacos a partir de la semana10 o la12 de embarazo, aproximadamente.  Se analizarn los resultados de los estudios solicitados en visitas anteriores. El mdico puede preguntarle:  Cmo se siente.  Si siente los movimientos del beb.  Si ha tenido sntomas anormales, como prdida de lquido, sangrado, dolores de cabeza intensos o clicos abdominales.    Si est consumiendo algn producto que contenga tabaco, como cigarrillos, tabaco de mascar y cigarrillos electrnicos.  Si tiene alguna pregunta. Otros estudios que pueden realizarse durante el primer trimestre incluyen lo siguiente:  Anlisis de sangre para determinar el tipo  de sangre y detectar la presencia de infecciones previas. Adems, se los usar para controlar si los niveles de hierro son bajos (anemia) y determinar los anticuerpos Rh. En una etapa ms avanzada del embarazo, se harn anlisis de sangre para saber si tiene diabetes, junto con otros estudios si surgen problemas.  Anlisis de orina para detectar infecciones, diabetes o protenas en la orina.  Una ecografa para confirmar que el beb crece y se desarrolla correctamente.  Una amniocentesis para diagnosticar posibles problemas genticos.  Estudios del feto para descartar espina bfida y sndrome de Down.  Es posible que necesite otras pruebas adicionales.  Prueba del VIH (virus de inmunodeficiencia humana). Los exmenes prenatales de rutina incluyen la prueba de deteccin del VIH, a menos que decida no realizrsela. INSTRUCCIONES PARA EL CUIDADO EN EL HOGAR Medicamentos:  Siga las indicaciones del mdico en relacin con el uso de medicamentos. Durante el embarazo, hay medicamentos que pueden tomarse y otros que no.  Tome las vitaminas prenatales como se le indic.  Si est estreida, tome un laxante suave, si el mdico lo autoriza. Dieta  Consuma alimentos balanceados. Elija alimentos variados, como carne o protenas de origen vegetal, pescado, leche y productos lcteos descremados, verduras, frutas y panes y cereales integrales. El mdico la ayudar a determinar la cantidad de peso que puede aumentar.  No coma carne cruda ni quesos sin cocinar. Estos elementos contienen bacterias que pueden causar defectos congnitos en el beb.  La ingesta diaria de cuatro o cinco comidas pequeas en lugar de tres comidas abundantes puede ayudar a aliviar las nuseas y los vmitos. Si empieza a tener nuseas, comer algunas galletas saladas puede ser de ayuda. Beber lquidos entre las comidas en lugar de tomarlos durante las comidas tambin puede ayudar a calmar las nuseas y los vmitos.  Si est  estreida, consuma alimentos con alto contenido de fibra, como verduras y frutas frescas, y cereales integrales. Beba suficiente lquido para mantener la orina clara o de color amarillo plido. Actividad y ejercicios  Haga ejercicio solamente como se lo haya indicado el mdico. El ejercicio la ayudar a: ? Controlar el peso. ? Mantenerse en forma. ? Estar preparada para el trabajo de parto y el parto.  Los dolores, los clicos en la parte baja del abdomen o los calambres en la cintura son un buen indicio de que debe dejar de hacer ejercicios. Consulte al mdico antes de seguir haciendo ejercicios normales.  Intente no estar de pie durante mucho tiempo. Mueva las piernas con frecuencia si debe estar de pie en un lugar durante mucho tiempo.  Evite levantar pesos excesivos.  Use zapatos de tacones bajos y mantenga una buena postura.  Puede seguir teniendo relaciones sexuales, excepto que el mdico le indique lo contrario. Alivio del dolor o las molestias  Use un sostn que le brinde buen soporte si siente dolor a la palpacin en las mamas.  Dese baos de asiento con agua tibia para aliviar el dolor o las molestias causadas por las hemorroides. Use crema antihemorroidal si el mdico se lo permite.  Descanse con las piernas elevadas si tiene calambres o dolor de cintura.  Si tiene venas varicosas en las piernas, use medias de descanso. Eleve los pies durante 15minutos, 3 o 4veces por   da. Limite la cantidad de sal en su dieta. Cuidados prenatales  Programe las visitas prenatales para la semana12 de embarazo. Generalmente se programan cada mes al principio y se hacen ms frecuentes en los 2 ltimos meses antes del parto.  Escriba sus preguntas. Llvelas cuando concurra a las visitas prenatales.  Concurra a todas las visitas prenatales como se lo haya indicado el mdico. Seguridad  Colquese el cinturn de seguridad cuando conduzca.  Haga una lista de los nmeros de telfono de  emergencia, que incluya los nmeros de telfono de familiares, amigos, el hospital y los departamentos de polica y bomberos. Consejos generales  Pdale al mdico que la derive a clases de educacin prenatal en su localidad. Debe comenzar a tomar las clases antes de entrar en el mes6 de embarazo.  Pida ayuda si tiene necesidades nutricionales o de asesoramiento durante el embarazo. El mdico puede aconsejarla o derivarla a especialistas para que la ayuden con diferentes necesidades.  No se d baos de inmersin en agua caliente, baos turcos ni saunas.  No se haga duchas vaginales ni use tampones o toallas higinicas perfumadas.  No mantenga las piernas cruzadas durante mucho tiempo.  Evite el contacto con las bandejas sanitarias de los gatos y la tierra que estos animales usan. Estos elementos contienen bacterias que pueden causar defectos congnitos al beb y la posible prdida del feto debido a un aborto espontneo o muerte fetal.  No fume, no consuma hierbas ni medicamentos que no hayan sido recetados por el mdico. Las sustancias qumicas que estos productos contienen afectan la formacin y el desarrollo del beb.  No consuma ningn producto que contenga tabaco, lo que incluye cigarrillos, tabaco de mascar y cigarrillos electrnicos. Si necesita ayuda para dejar de fumar, consulte al mdico. Puede recibir asesoramiento y otro tipo de recursos para dejar de fumar.  Programe una cita con el dentista. En su casa, lvese los dientes con un cepillo dental blando y psese el hilo dental con suavidad. SOLICITE ATENCIN MDICA SI:  Tiene mareos.  Siente clicos leves, presin en la pelvis o dolor persistente en el abdomen.  Tiene nuseas, vmitos o diarrea persistentes.  Tiene secrecin vaginal con mal olor.  Siente dolor al orinar.  Tiene el rostro, las manos, las piernas o los tobillos ms hinchados.  SOLICITE ATENCIN MDICA DE INMEDIATO SI:  Tiene fiebre.  Tiene una prdida de  lquido por la vagina.  Tiene sangrado o pequeas prdidas vaginales.  Siente dolor intenso o clicos en el abdomen.  Sube o baja de peso rpidamente.  Vomita sangre de color rojo brillante o material que parezca granos de caf.  Ha estado expuesta a la rubola y no ha sufrido la enfermedad.  Ha estado expuesta a la quinta enfermedad o a la varicela.  Tiene un dolor de cabeza intenso.  Le falta el aire.  Sufre cualquier tipo de traumatismo, por ejemplo, debido a una cada o un accidente automovilstico.  Esta informacin no tiene como fin reemplazar el consejo del mdico. Asegrese de hacerle al mdico cualquier pregunta que tenga. Document Released: 11/15/2004 Document Revised: 02/26/2014 Document Reviewed: 12/16/2012 Elsevier Interactive Patient Education  2017 Elsevier Inc.  

## 2016-09-26 NOTE — Progress Notes (Signed)
  Subjective:  Marlyn Palacios-Benitez is a 36 y.o. G2P0010 Hispanic female at 5687w4d by LMP c/w 6wk u/s, being seen today for her first obstetrical visit.  Her obstetrical history is significant for SAB x 1.  Pregnancy history fully reviewed.  Patient reports went to [facility] 8/5 w/ vaginal bleeding/cramping, Istat HCG >2,000, no u/s- dx w/ threatened miscarriage. Walked into office 8/6 am, we drew BHCG 55,513. Reports bleeding stopped yesterday, having some mild cramping. Also reports nausea, wants meds. Denies uti s/s, abnormal/malodorous vag d/c, or vulvovaginal itching/irritation.  BP 106/60   Pulse 64   Wt 208 lb (94.3 kg)   LMP 07/21/2016 (Exact Date)   BMI 39.30 kg/m   HISTORY: OB History  Gravida Para Term Preterm AB Living  2 0 0 0 1 0  SAB TAB Ectopic Multiple Live Births  1 0 0 0 0    # Outcome Date GA Lbr Len/2nd Weight Sex Delivery Anes PTL Lv  2 Current           1 SAB 01/2016             Past Medical History:  Diagnosis Date  . Abscess 02/29/2016  . Cyst of ovary, right 11/29/2015  . History of PCOS    Past Surgical History:  Procedure Laterality Date  . CHOLECYSTECTOMY N/A 05/02/2016   Procedure: LAPAROSCOPIC CHOLECYSTECTOMY;  Surgeon: Franky MachoMark Jenkins, MD;  Location: [facility];  Service: General;  Laterality: N/A;  . INCISION AND DRAINAGE     right outer leg   Family History  Problem Relation Age of Onset  . Arthritis/Rheumatoid Father   . Diabetes Mother   . Diabetes Sister     Exam   System:     General: Well developed & nourished, no acute distress   Skin: Warm & dry, normal coloration and turgor, no rashes   Neurologic: Alert & oriented, normal mood   Cardiovascular: Regular rate & rhythm   Respiratory: Effort & rate normal, LCTAB, acyanotic   Abdomen: Soft, non tender   Extremities: normal strength, tone   FHR: + via informal transabdominal u/s   Assessment:   Pregnancy: G2P0010 Patient Active Problem List   Diagnosis Date Noted  . Vaginal spotting  09/04/2016  . Threatened abortion in early pregnancy 09/04/2016  . Pregnancy 08/27/2016  . Advanced maternal age, primigravida, antepartum 08/27/2016  . Positive pregnancy test 08/27/2016  . Acute cholecystitis 05/01/2016  . Cholecystitis 05/01/2016  . Spontaneous abortion in first trimester 03/12/2016  . Abscess 02/29/2016  . Cyst of ovary, right 11/29/2015    4687w4d G2P0010 New OB visit Vag bleeding 1st trimester- now resolved Rh+ H/O SAB AMA Nausea  Plan:  Initial labs obtained Continue prenatal vitamins Problem list reviewed and updated Reviewed n/v relief measures and warning s/s to report Rx Bonj[place], Tish to do PA, let us know if any problems getting Reviewed recommended weight gain based on pre-gravid BMI Encouraged well-balanced diet Genetic Screening discussed Integrated Screen: requested Cystic fibrosis screening discussed declined Ultrasound discussed; fetal survey: requested Follow up in 4 weeks for visit and 1st IT/NT CCNC completed Pelvic rest x 7d from seeing any blood  Marge DuncansBooker, Corneshia Hines Randall CNM, [facility] HospitalWHNP-BC 09/26/2016 9:20 AM

## 2016-09-27 ENCOUNTER — Other Ambulatory Visit: Payer: Self-pay | Admitting: Women's Health

## 2016-09-27 LAB — MICROSCOPIC EXAMINATION: CASTS: NONE SEEN /LPF

## 2016-09-27 LAB — CBC
HEMATOCRIT: 41.6 % (ref 34.0–46.6)
HEMOGLOBIN: 13.9 g/dL (ref 11.1–15.9)
MCH: 29.1 pg (ref 26.6–33.0)
MCHC: 33.4 g/dL (ref 31.5–35.7)
MCV: 87 fL (ref 79–97)
Platelets: 262 10*3/uL (ref 150–379)
RBC: 4.77 x10E6/uL (ref 3.77–5.28)
RDW: 15.1 % (ref 12.3–15.4)
WBC: 5.8 10*3/uL (ref 3.4–10.8)

## 2016-09-27 LAB — PMP SCREEN PROFILE (10S), URINE
Amphetamine Scrn, Ur: NEGATIVE ng/mL
BARBITURATE SCREEN URINE: NEGATIVE ng/mL
BENZODIAZEPINE SCREEN, URINE: NEGATIVE ng/mL
CANNABINOIDS UR QL SCN: NEGATIVE ng/mL
COCAINE(METAB.)SCREEN, URINE: NEGATIVE ng/mL
Creatinine(Crt), U: 20.5 mg/dL (ref 20.0–300.0)
METHADONE SCREEN, URINE: NEGATIVE ng/mL
OXYCODONE+OXYMORPHONE UR QL SCN: NEGATIVE ng/mL
Opiate Scrn, Ur: NEGATIVE ng/mL
PROPOXYPHENE SCREEN URINE: NEGATIVE ng/mL
Ph of Urine: 6.5 (ref 4.5–8.9)
Phencyclidine Qn, Ur: NEGATIVE ng/mL

## 2016-09-27 LAB — URINALYSIS, ROUTINE W REFLEX MICROSCOPIC
BILIRUBIN UA: NEGATIVE
Glucose, UA: NEGATIVE
Ketones, UA: NEGATIVE
NITRITE UA: NEGATIVE
PH UA: 6.5 (ref 5.0–7.5)
Protein, UA: NEGATIVE
Urobilinogen, Ur: 0.2 mg/dL (ref 0.2–1.0)

## 2016-09-27 LAB — ANTIBODY SCREEN: Antibody Screen: NEGATIVE

## 2016-09-27 LAB — RUBELLA SCREEN: RUBELLA: 15.4 {index} (ref >0.99)

## 2016-09-27 LAB — RPR: RPR Ser Ql: NONREACTIVE

## 2016-09-27 LAB — HIV ANTIBODY (ROUTINE TESTING W REFLEX): HIV Screen 4th Generation wRfx: NONREACTIVE

## 2016-09-27 LAB — ABO/RH: RH TYPE: POSITIVE

## 2016-09-27 LAB — VARICELLA ZOSTER ANTIBODY, IGG: Varicella zoster IgG: 1821 index (ref >165)

## 2016-09-27 LAB — HEPATITIS B SURFACE ANTIGEN: Hepatitis B Surface Ag: NEGATIVE

## 2016-09-27 MED ORDER — PROMETHAZINE HCL 25 MG PO TABS: 12.5000 mg | ORAL_TABLET | Freq: Four times a day (QID) | ORAL | 0 refills | Status: DC | PRN

## 2016-09-28 LAB — URINE CULTURE

## 2016-09-28 LAB — GC/CHLAMYDIA PROBE AMP
Chlamydia trachomatis, NAA: NEGATIVE
Neisseria gonorrhoeae by PCR: NEGATIVE

## 2016-10-24 ENCOUNTER — Ambulatory Visit (INDEPENDENT_AMBULATORY_CARE_PROVIDER_SITE_OTHER): Payer: Self-pay | Admitting: Women's Health

## 2016-10-24 ENCOUNTER — Ambulatory Visit (INDEPENDENT_AMBULATORY_CARE_PROVIDER_SITE_OTHER): Payer: Self-pay

## 2016-10-24 ENCOUNTER — Encounter: Payer: Self-pay | Admitting: Women's Health

## 2016-10-24 VITALS — BP 116/70 | HR 68 | Wt 215.0 lb

## 2016-10-24 DIAGNOSIS — Z1389 Encounter for screening for other disorder: Secondary | ICD-10-CM

## 2016-10-24 DIAGNOSIS — Z1379 Encounter for other screening for genetic and chromosomal anomalies: Secondary | ICD-10-CM

## 2016-10-24 DIAGNOSIS — Z3481 Encounter for supervision of other normal pregnancy, first trimester: Secondary | ICD-10-CM

## 2016-10-24 DIAGNOSIS — Z3A13 13 weeks gestation of pregnancy: Secondary | ICD-10-CM

## 2016-10-24 DIAGNOSIS — Z331 Pregnant state, incidental: Secondary | ICD-10-CM

## 2016-10-24 DIAGNOSIS — Z3682 Encounter for antenatal screening for nuchal translucency: Secondary | ICD-10-CM

## 2016-10-24 LAB — POCT URINALYSIS DIPSTICK
Blood, UA: NEGATIVE
Glucose, UA: NEGATIVE
KETONES UA: NEGATIVE
NITRITE UA: NEGATIVE
PROTEIN UA: NEGATIVE

## 2016-10-24 NOTE — Progress Notes (Signed)
Low-risk OB appointment G2P0010 7538w4d Estimated Date of Delivery: 04/27/17 BP 116/70   Pulse 68   Wt 215 lb (97.5 kg)   LMP 07/21/2016 (Exact Date)   BMI 40.62 kg/m   BP, weight, and urine reviewed.  Refer to obstetrical flow sheet for FH & FHR.  No fm yet. Denies cramping, lof, vb, or uti s/s. No complaints. Reviewed today's normal nt u/s, small simple Rt cyst, warning s/s to report. Plan:  Continue routine obstetrical care  F/U in 3wks for OB appointment and 2nd IT 1st IT/NT today

## 2016-10-24 NOTE — Progress Notes (Signed)
US 13+4 wks,measurements c/w dates,NB present,NT 1.2 mm,normal left ovary,simple right paraovarian cyst 2.5 x 2.4 x 1.6 cm,crl 63.72 mm,fhr 155 bpm,post pl gr 0

## 2016-10-24 NOTE — Patient Instructions (Signed)

## 2016-10-30 LAB — INTEGRATED 1
CROWN RUMP LENGTH MAT SCREEN: 63.7 mm
Gest. Age on Collection Date: 12.7 weeks
MATERNAL AGE AT EDD: 36.7 a
NUCHAL TRANSLUCENCY (NT): 1.2 mm
NUMBER OF FETUSES: 1
PAPP-A Value: 320.6 ng/mL
Weight: 215 [lb_av]

## 2016-11-14 ENCOUNTER — Encounter: Payer: Self-pay | Admitting: Obstetrics and Gynecology

## 2016-11-14 ENCOUNTER — Ambulatory Visit (INDEPENDENT_AMBULATORY_CARE_PROVIDER_SITE_OTHER): Payer: Self-pay | Admitting: Obstetrics and Gynecology

## 2016-11-14 VITALS — BP 110/66 | HR 82 | Wt 219.5 lb

## 2016-11-14 DIAGNOSIS — O09892 Supervision of other high risk pregnancies, second trimester: Secondary | ICD-10-CM

## 2016-11-14 DIAGNOSIS — Z1389 Encounter for screening for other disorder: Secondary | ICD-10-CM

## 2016-11-14 DIAGNOSIS — Z8759 Personal history of other complications of pregnancy, childbirth and the puerperium: Secondary | ICD-10-CM

## 2016-11-14 DIAGNOSIS — B379 Candidiasis, unspecified: Secondary | ICD-10-CM

## 2016-11-14 DIAGNOSIS — Z331 Pregnant state, incidental: Secondary | ICD-10-CM

## 2016-11-14 DIAGNOSIS — O99212 Obesity complicating pregnancy, second trimester: Secondary | ICD-10-CM

## 2016-11-14 DIAGNOSIS — Z1379 Encounter for other screening for genetic and chromosomal anomalies: Secondary | ICD-10-CM

## 2016-11-14 DIAGNOSIS — Z3482 Encounter for supervision of other normal pregnancy, second trimester: Secondary | ICD-10-CM

## 2016-11-14 DIAGNOSIS — Z3A16 16 weeks gestation of pregnancy: Secondary | ICD-10-CM

## 2016-11-14 LAB — POCT URINALYSIS DIPSTICK
Blood, UA: NEGATIVE
Glucose, UA: NEGATIVE
KETONES UA: NEGATIVE
Nitrite, UA: NEGATIVE
Protein, UA: NEGATIVE

## 2016-11-14 MED ORDER — MICONAZOLE NITRATE 2 % VA CREA: 1.0000 | TOPICAL_CREAM | Freq: Every day | VAGINAL | Status: DC

## 2016-11-14 MED ORDER — FLUCONAZOLE 150 MG PO TABS: 150.0000 mg | ORAL_TABLET | Freq: Once | ORAL | 1 refills | Status: AC

## 2016-11-14 NOTE — Progress Notes (Signed)
Patient ID: Eileen [name], female   DOB: 12-29-1980, 36 y.o.   MRN: [id number] G2P0010  Estimated Date of Delivery: 04/27/17 LROB [redacted]w[redacted]d  Chief Complaint  Patient presents with  . Routine Prenatal Visit    2nd IT; vaginal itching; "jelly-like discharge"   ____  Patient complaints: She reports vaginal itching. She also notes reflux. This is her second pregnancy, her first she lost after about 6 weeks this past January 2018.  Patient reports good fetal movement, denies any bleeding, rupture of membranes,or regular contractions.  Blood pressure 110/66, pulse 82, weight 219 lb 8 oz (99.6 kg), last menstrual period 07/21/2016, unknown if currently breastfeeding.   Urine results:notable for trace leukocytes refer to the ob flow sheet for FH and FHR, ,                          Physical Examination: General appearance - alert, well appearing, and in no distress, oriented to person, place, and time and overweight Abdomen - FH - not done,       - FHR 147 Pelvic - External tissues are healthy Vagina - Thick clumpy discharge and KOH is positive for yeast                    Questions were answered.  Assessment: LROB G2P0010 @ [redacted]w[redacted]d  2. Obese 3.  Vaginal yeast infection  Plan:  1. Continued routine obstetrical care 2. Pt was advised to not gain weight 3. Diflucan and monistat for yeast  F/u in 4 weeks for LROB, U/S, anatomy  By signing my name below, I, Diona Browner, attest that this documentation has been prepared under the direction and in the presence of Tilda Burrow, MD. Electronically Signed: Diona Browner, Medical Scribe. 11/14/16. 9:08 AM.  I personally performed the services described in this documentation, which was SCRIBED in my presence. The recorded information has been reviewed and considered accurate. It has been edited as necessary during review. Tilda Burrow, MD

## 2016-11-15 ENCOUNTER — Other Ambulatory Visit: Payer: Self-pay | Admitting: Obstetrics and Gynecology

## 2016-11-15 DIAGNOSIS — B3731 Acute candidiasis of vulva and vagina: Secondary | ICD-10-CM | POA: Insufficient documentation

## 2016-11-15 DIAGNOSIS — B373 Candidiasis of vulva and vagina: Secondary | ICD-10-CM

## 2016-11-15 MED ORDER — TERCONAZOLE 0.4 % VA CREA: 1.0000 | TOPICAL_CREAM | Freq: Every day | VAGINAL | 1 refills | Status: DC

## 2016-11-15 NOTE — Progress Notes (Signed)
Monilia vulvovaginitis, Rx terazol.

## 2016-11-21 LAB — INTEGRATED 2
ADSF: 0.78
AFP MoM: 0.93
Alpha-Fetoprotein: 20.6 ng/mL
Crown Rump Length: 63.7 mm
DIA MOM: 1.91
DIA Value: 264.3 pg/mL
ESTRIOL UNCONJUGATED: 0.5 ng/mL
GEST. AGE ON COLLECTION DATE: 12.7 wk
Gestational Age: 15.7 weeks
HCG MOM: 2.64
Maternal Age at EDD: 36.7 yr
NUCHAL TRANSLUCENCY MOM: 0.75
NUMBER OF FETUSES: 1
Nuchal Translucency (NT): 1.2 mm
PAPP-A MoM: 0.49
PAPP-A Value: 320.6 ng/mL
TEST RESULTS: POSITIVE — AB
Weight: 215 [lb_av]
Weight: 219 [lb_av]
hCG Value: 74.1 IU/mL

## 2016-12-11 ENCOUNTER — Other Ambulatory Visit: Payer: Self-pay | Admitting: Obstetrics and Gynecology

## 2016-12-11 DIAGNOSIS — Z363 Encounter for antenatal screening for malformations: Secondary | ICD-10-CM

## 2016-12-12 ENCOUNTER — Encounter: Payer: Self-pay | Admitting: Advanced Practice Midwife

## 2016-12-12 ENCOUNTER — Ambulatory Visit (INDEPENDENT_AMBULATORY_CARE_PROVIDER_SITE_OTHER): Payer: Self-pay | Admitting: Advanced Practice Midwife

## 2016-12-12 ENCOUNTER — Ambulatory Visit (INDEPENDENT_AMBULATORY_CARE_PROVIDER_SITE_OTHER): Payer: Self-pay

## 2016-12-12 VITALS — BP 124/70 | HR 92 | Wt 227.0 lb

## 2016-12-12 DIAGNOSIS — Z331 Pregnant state, incidental: Secondary | ICD-10-CM

## 2016-12-12 DIAGNOSIS — Z3482 Encounter for supervision of other normal pregnancy, second trimester: Secondary | ICD-10-CM

## 2016-12-12 DIAGNOSIS — Z1389 Encounter for screening for other disorder: Secondary | ICD-10-CM

## 2016-12-12 DIAGNOSIS — Z363 Encounter for antenatal screening for malformations: Secondary | ICD-10-CM

## 2016-12-12 DIAGNOSIS — Z3A20 20 weeks gestation of pregnancy: Secondary | ICD-10-CM

## 2016-12-12 DIAGNOSIS — O281 Abnormal biochemical finding on antenatal screening of mother: Secondary | ICD-10-CM

## 2016-12-12 LAB — POCT URINALYSIS DIPSTICK
Blood, UA: NEGATIVE
Glucose, UA: NEGATIVE
KETONES UA: NEGATIVE
Leukocytes, UA: NEGATIVE
Nitrite, UA: NEGATIVE
Protein, UA: NEGATIVE

## 2016-12-12 NOTE — Progress Notes (Signed)
US 20+4 wks,breech,post/fundal pl gr 0,normal ovaries bilat,svp of fluid 4.8 cm,fhr 138 bpm,cx 4.8 cm,anatomy complete,no obvious abnormalities

## 2016-12-12 NOTE — Progress Notes (Signed)
G2P0010 8938w4d Estimated Date of Delivery: 04/27/17  Blood pressure 124/70, pulse 92, weight 227 lb (103 kg), last menstrual period 07/21/2016, unknown if currently breastfeeding.   BP weight and urine results all reviewed and noted.  Please refer to the obstetrical flow sheet for the fundal height and fetal heart rate documentation: US 20+4 wks,breech,post/fundal pl gr 0,normal ovaries bilat,svp of fluid 4.8 cm,fhr 138 bpm,cx 4.8 cm,anatomy complete,no obvious abnormalities  Patient reports good fetal movement, denies any bleeding and no rupture of membranes symptoms or regular contractions. Patient is without complaints. Informed of abnormal DSR (1:193).  Options discussed (amnio, CfDNA, nothing).  Interpreter used All questions were answered.  Orders Placed This Encounter  Procedures  . POCT urinalysis dipstick    Plan:  Continued routine obstetrical care, let us know if she wants furhter testing (informasique is $299)  Return in about 4 weeks (around 01/09/2017) for LROB.

## 2017-01-01 ENCOUNTER — Telehealth: Payer: Self-pay | Admitting: Obstetrics and Gynecology

## 2017-01-01 NOTE — Telephone Encounter (Signed)
patient came into the office on Friday afternoon before closing and requested to have a letter written out stating that she is able to go back to work. Please contact pt when letter is ready for pick up.

## 2017-01-02 ENCOUNTER — Encounter: Payer: Self-pay | Admitting: *Deleted

## 2017-01-02 NOTE — Telephone Encounter (Signed)
Letter at front desk for patient to pick up. 

## 2017-01-02 NOTE — Telephone Encounter (Signed)
I don't see where we took her out of work, or why we would have done that?? I don't have a problem w/her working unless I';m just not getting something.  I would like to know why we took her out to start with

## 2017-01-02 NOTE — Telephone Encounter (Signed)
Ok, that's fine.

## 2017-01-02 NOTE — Telephone Encounter (Signed)
Pt states that early in pregnancy she was bleeding and was told to not work (I don't see any documentation of this). Her job is requesting a letter stating that she can return or is able to work.

## 2017-01-09 ENCOUNTER — Ambulatory Visit (INDEPENDENT_AMBULATORY_CARE_PROVIDER_SITE_OTHER): Payer: Self-pay | Admitting: Advanced Practice Midwife

## 2017-01-09 ENCOUNTER — Other Ambulatory Visit: Payer: Self-pay

## 2017-01-09 ENCOUNTER — Encounter: Payer: Self-pay | Admitting: Advanced Practice Midwife

## 2017-01-09 VITALS — BP 128/70 | HR 93 | Wt 233.0 lb

## 2017-01-09 DIAGNOSIS — Z331 Pregnant state, incidental: Secondary | ICD-10-CM

## 2017-01-09 DIAGNOSIS — Z1389 Encounter for screening for other disorder: Secondary | ICD-10-CM

## 2017-01-09 DIAGNOSIS — Z3A24 24 weeks gestation of pregnancy: Secondary | ICD-10-CM

## 2017-01-09 DIAGNOSIS — Z3482 Encounter for supervision of other normal pregnancy, second trimester: Secondary | ICD-10-CM

## 2017-01-09 LAB — POCT URINALYSIS DIPSTICK
Glucose, UA: NEGATIVE
Ketones, UA: NEGATIVE
NITRITE UA: NEGATIVE
Protein, UA: NEGATIVE
RBC UA: NEGATIVE

## 2017-01-09 NOTE — Progress Notes (Signed)
LOW-RISK PREGNANCY VISIT Patient name: Eileen [name] MRN [id number]  Date of birth: 1980/04/03 Chief Complaint:   Routine Prenatal Visit  History of Present Illness:   Eileen [name] is a 36 y.o. 502P0010 female at 7159w4d with an Estimated Date of Delivery: 04/27/17 being seen today for ongoing management of a low-risk pregnancy.  Today she reports having some pressure since Saturday. Contractions: Not present. Vag. Bleeding: None.  Movement: Present. denies leaking of fluid. Review of Systems:   Pertinent items are noted in HPI.  Had taken herself out of work first trimester d/t spottig, wants to go back (housedeeper).  Note written Denies abnormal vaginal discharge w/ itching/odor/irritation, headaches, visual changes, shortness of breath, chest pain, abdominal pain, severe nausea/vomiting, or problems with urination or bowel movements unless otherwise stated above.  Pertinent History Reviewed:  Medical & Surgical Hx:   Past Medical History:  Diagnosis Date  . Abscess 02/29/2016  . Cyst of ovary, right 11/29/2015  . History of PCOS    Past Surgical History:  Procedure Laterality Date  . CHOLECYSTECTOMY N/A 05/02/2016   Procedure: LAPAROSCOPIC CHOLECYSTECTOMY;  Surgeon: Franky MachoMark Jenkins, MD;  Location: [facility];  Service: General;  Laterality: N/A;  . INCISION AND DRAINAGE     right outer leg   Family History  Problem Relation Age of Onset  . Arthritis/Rheumatoid Father   . Diabetes Mother   . Diabetes Sister     Current Outpatient Medications:  .  prenatal vitamin w/FE, FA (PRENATAL 1 + 1) 27-1 MG TABS tablet, Take 1 tablet by mouth daily at 12 noon., Disp: 30 each, Rfl: 12 .  terconazole (TERAZOL 7) 0.4 % vaginal cream, Place 1 applicator vaginally at bedtime. (Patient not taking: Reported on 01/09/2017), Disp: 45 g, Rfl: 1  Current Facility-Administered Medications:  .  miconazole (MONISTAT 7) 2 % vaginal cream 1 Applicatorful, 1 Applicatorful, Vaginal, QHS,  Tilda BurrowFerguson, John V, MD Social History: Reviewed -  reports that  has never smoked. she has never used smokeless tobacco.  Physical Assessment:   Vitals:   01/09/17 0839  BP: 128/70  Pulse: 93  Weight: 233 lb (105.7 kg)  Body mass index is 44.02 kg/m.        Physical Examination:   General appearance: Well appearing, and in no distress  Mental status: Alert, oriented to person, place, and time  Skin: Warm & dry  Cardiovascular: Normal heart rate noted  Respiratory: Normal respiratory effort, no distress  Abdomen: Soft, gravid, nontender  Pelvic: Cervical exam deferred         Extremities: Edema: Trace  Fetal Status: Fetal Heart Rate (bpm): 140 Fundal Height: 12 cm Movement: Present    Results for orders placed or performed in visit on 01/09/17 (from the past 24 hour(s))  POCT urinalysis dipstick   Collection Time: 01/09/17  8:41 AM  Result Value Ref Range   Color, UA     Clarity, UA     Glucose, UA neg    Bilirubin, UA     Ketones, UA neg    Spec Grav, UA  1.010 - 1.025   Blood, UA neg    pH, UA  5.0 - 8.0   Protein, UA neg    Urobilinogen, UA  0.2 or 1.0 E.U./dL   Nitrite, UA neg    Leukocytes, UA Large (3+) (A) Negative    Assessment & Plan:  1) Low-risk pregnancy G2P0010 at 3259w4d with an Estimated Date of Delivery: 04/27/17   2) 1:193 DSR, declined further  testing   Labs/procedures/US today: none  Plan:  Continue routine obstetrical care    Follow-up: Return in about 3 weeks (around 01/30/2017) for PN2/LROB.  Orders Placed This Encounter  Procedures  . POCT urinalysis dipstick   CRESENZO-DISHMAN,Lazaria Schaben CNM 01/09/2017 9:14 AM

## 2017-01-09 NOTE — Patient Instructions (Signed)
1. Before your test, do not eat or drink anything for 8-10 hours prior to your  appointment (a small amount of water is allowed and you may take any medicines you normally take). Be sure to drink lots of water the day before. 2. When you arrive, your blood will be drawn for a 'fasting' blood sugar level.  Then you will be given a sweetened carbonated beverage to drink. You should  complete drinking this beverage within five minutes. After finishing the  beverage, you will have your blood drawn exactly 1 and 2 hours later. Having  your blood drawn on time is an important part of this test. A total of three blood  samples will be done. 3. The test takes approximately 2  hours. During the test, do not have anything to  eat or drink. Do not smoke, chew gum (not even sugarless gum) or use breath mints.  4. During the test you should remain close by and seated as much as possible and  avoid walking around. You may want to bring a book or something else to  occupy your time.  5. After your test, you may eat and drink as normal. You may want to bring a snack  to eat after the test is finished. Your provider will advise you as to the results of  this test and any follow-up if necessary  If your sugar test is positive for gestational diabetes, you will be given an phone call and further instructions discussed. If you wish to know all of your test results before your next appointment, feel free to call the office, or look up your test results on Mychart.  (The range that the lab uses for normal values of the sugar test are not necessarily the range that is used for pregnant women; if your results are within the normal range, they are definitely normal.  However, if a value is deemed "high" by the lab, it may not be too high for a pregnant woman.  We will need to discuss the results if your value(s) fall in the "high" category).     Tdap Vaccine  It is recommended that you get the Tdap vaccine during the  third trimester of EACH pregnancy to help protect your baby from getting pertussis (whooping cough)  27-36 weeks is the BEST time to do this so that you can pass the protection on to your baby. During pregnancy is better than after pregnancy, but if you are unable to get it during pregnancy it will be offered at the hospital.  You can get this vaccine at the health department or your family doctor, as well as some pharmacies.  Everyone who will be around your baby should also be up-to-date on their vaccines. Adults (who are not pregnant) only need 1 dose of Tdap during adulthood.     Segundo trimestre de Psychiatristembarazo (Second Trimester of Pregnancy) El segundo trimestre va desde la semana13 hasta la 28, desde el cuarto hasta el sexto mes, y suele ser el momento en el que mejor se siente. Su organismo se ha adaptado a Charity fundraiserestar embarazada y comienza a Diplomatic Services operational officersentirse fsicamente mejor. En general, las nuseas matutinas han disminuido o han desaparecido completamente, puede tener ms energa y un aumento de apetito. El segundo trimestre es tambin la poca en la que el feto se desarrolla rpidamente. Hacia el final del sexto mes, el feto mide aproximadamente 9pulgadas (23cm) y pesa alrededor de 1 libras (700g). Es probable que sienta que el beb  se mueve (da pataditas) entre las 18 y 20semanas del Psychiatristembarazo. CAMBIOS EN EL ORGANISMO Su organismo atraviesa por muchos cambios durante el Gaffneyembarazo, y estos varan de Neomia Dearuna mujer a Educational psychologistotra.  Seguir American Standard Companiesaumentando de peso. Notar que la parte baja del abdomen sobresale.  Podrn aparecer las primeras Albertson'sestras en las caderas, el abdomen y las Oldsmarmamas.  Es posible que tenga dolores de cabeza que pueden aliviarse con los medicamentos que el mdico le permita tomar.  Tal vez tenga necesidad de orinar con ms frecuencia porque el feto est ejerciendo presin Ambulance personsobre la vejiga.  Debido al Vanetta Muldersembarazo podr sentir Anthoney Haradaacidez estomacal con frecuencia.  Puede estar estreida, ya que ciertas  hormonas enlentecen los movimientos de los msculos que New York Life Insuranceempujan los desechos a travs de los intestinos.  Pueden aparecer hemorroides o abultarse e hincharse las venas (venas varicosas).  Puede tener dolor de espalda que se debe al Citigroupaumento de peso y a que las hormonas del Management consultantembarazo relajan las articulaciones entre los huesos de la pelvis, y Public librariancomo consecuencia de la modificacin del peso y los msculos que mantienen el equilibrio.  Las ConAgra Foodsmamas seguirn creciendo y Development worker, communityle dolern.  Las Veterinary surgeonencas pueden sangrar y estar sensibles al cepillado y al hilo dental.  Pueden aparecer zonas oscuras o manchas (cloasma, mscara del Psychiatristembarazo) en el rostro que probablemente se atenuar despus del nacimiento del beb.  Es posible que se forme una lnea oscura desde el ombligo hasta la zona del pubis (linea nigra) que probablemente se atenuar despus del nacimiento del beb.  Tal vez haya cambios en el cabello que pueden incluir su engrosamiento, crecimiento rpido y cambios en la textura. Adems, a algunas mujeres se les cae el cabello durante o despus del embarazo, o tienen el cabello seco o fino. Lo ms probable es que el cabello se le normalice despus del nacimiento del beb. QU DEBE ESPERAR EN LAS CONSULTAS PRENATALES Durante una visita prenatal de rutina:  La pesarn para asegurarse de que usted y el feto estn creciendo normalmente.  Le tomarn la presin arterial.  Le medirn el abdomen para controlar el desarrollo del beb.  Se escucharn los latidos cardacos fetales.  Se evaluarn los resultados de los estudios solicitados en visitas anteriores. El mdico puede preguntarle lo siguiente:  Cmo se siente.  Si siente los movimientos del beb.  Si ha tenido sntomas anormales, como prdida de lquido, Rexsangrado, dolores de cabeza intensos o clicos abdominales.  Si est consumiendo algn producto que contenga tabaco, como cigarrillos, tabaco de Theatre managermascar y Administrator, Civil Servicecigarrillos electrnicos.  Si tiene Texas Instrumentsalguna  pregunta. Otros estudios que podrn realizarse durante el segundo trimestre incluyen lo siguiente:  Anlisis de sangre para detectar lo siguiente: ? Concentraciones de hierro bajas (anemia). ? Diabetes gestacional (entre la semana 24 y la 28). ? Anticuerpos Rh.  Anlisis de orina para detectar infecciones, diabetes o protenas en la orina.  Una ecografa para confirmar que el beb crece y se desarrolla correctamente.  Una amniocentesis para diagnosticar posibles problemas genticos.  Estudios del feto para descartar espina bfida y sndrome de Down.  Prueba del VIH (virus de inmunodeficiencia humana). Los exmenes prenatales de rutina incluyen la prueba de deteccin del VIH, a menos que decida no Futures traderrealizrsela. INSTRUCCIONES PARA EL CUIDADO EN EL HOGAR  Evite fumar, consumir hierbas, beber alcohol y tomar frmacos que no le hayan recetado. Estas sustancias qumicas afectan la formacin y el desarrollo del beb.  No consuma ningn producto que contenga tabaco, lo que incluye cigarrillos, tabaco de Theatre managermascar y Administrator, Civil Servicecigarrillos electrnicos.  Si necesita ayuda para dejar de fumar, consulte al American Express. Puede recibir asesoramiento y otro tipo de recursos para dejar de fumar.  Siga las indicaciones del mdico en relacin con el uso de medicamentos. Durante el embarazo, hay medicamentos que son seguros de tomar y otros que no.  Haga ejercicio solamente como se lo haya indicado el mdico. Sentir clicos uterinos es un buen signo para Restaurant manager, fast food actividad fsica.  Contine comiendo alimentos sanos con regularidad.  Use un sostn que le brinde buen soporte si le [employer].  No se d baos de inmersin en agua caliente, baos turcos ni saunas.  Use el cinturn de seguridad en todo momento mientras conduce.  No coma carne cruda ni queso sin cocinar; evite el contacto con las bandejas sanitarias de los gatos y la tierra que estos animales usan. Estos elementos contienen grmenes que pueden causar  defectos congnitos en el beb.  Tome las vitaminas prenatales.  Tome entre 1500 y 2000mg  de calcio diariamente comenzando en la semana20 del embarazo [place].  Si est estreida, pruebe un laxante suave (si el mdico lo autoriza). Consuma ms alimentos ricos en fibra, como vegetales y frutas frescos y Radiation protection practitioner. Beba gran cantidad de lquido para mantener la orina de tono claro o color amarillo plido.  Dese baos de asiento con agua tibia para Engineer, materials o las molestias causadas por las hemorroides. Use una crema para las hemorroides si el mdico la autoriza.  Si tiene venas varicosas, use medias de descanso. Eleve los pies durante , 3 o 4veces por da. Limite el consumo de sal en su dieta.  No levante objetos pesados, use zapatos de tacones bajos y [address].  Descanse con las piernas elevadas si tiene calambres o dolor de cintura.  Visite a su dentista si an no lo ha Occupational hygienist. Use un cepillo de dientes blando para higienizarse los dientes y psese el hilo dental con suavidad.  Puede seguir [employer] Corporation, a menos que el mdico le indique lo contrario.  Concurra a todas las visitas prenatales segn las indicaciones de su mdico.  SOLICITE ATENCIN MDICA SI:  [place].  Siente clicos leves, presin en la pelvis o dolor persistente en el abdomen.  Tiene nuseas, vmitos o diarrea persistentes.  Brett Fairy secrecin vaginal con mal olor.  Siente dolor al ConocoPhillips.  SOLICITE ATENCIN MDICA DE INMEDIATO SI:  Tiene fiebre.  Tiene una prdida de lquido por la vagina.  Tiene sangrado o pequeas prdidas vaginales.  Siente dolor intenso o clicos en el abdomen.  Sube o baja de peso rpidamente.  Tiene dificultad para respirar y siente dolor de pecho.  Sbitamente se le hinchan mucho el rostro, las [place], los tobillos, los pies o las piernas.  No ha sentido los movimientos del  beb durante [name].  Siente un dolor de cabeza intenso que no se alivia con medicamentos.  Su visin se modifica.  Esta informacin no tiene Theme park manager el consejo del mdico. Asegrese de hacerle al mdico cualquier pregunta que tenga. Document Released: 11/15/2004 Document Revised: 02/26/2014 Document Reviewed: 04/08/2012 Elsevier Interactive Patient Education  2017 [employer].

## 2017-01-30 ENCOUNTER — Other Ambulatory Visit: Payer: Self-pay

## 2017-01-30 ENCOUNTER — Encounter: Payer: Self-pay | Admitting: Women's Health

## 2017-01-30 ENCOUNTER — Ambulatory Visit (INDEPENDENT_AMBULATORY_CARE_PROVIDER_SITE_OTHER): Payer: Self-pay | Admitting: Women's Health

## 2017-01-30 VITALS — BP 120/80 | HR 96 | Wt 236.0 lb

## 2017-01-30 DIAGNOSIS — Z331 Pregnant state, incidental: Secondary | ICD-10-CM

## 2017-01-30 DIAGNOSIS — Z3A27 27 weeks gestation of pregnancy: Secondary | ICD-10-CM

## 2017-01-30 DIAGNOSIS — Z1389 Encounter for screening for other disorder: Secondary | ICD-10-CM

## 2017-01-30 DIAGNOSIS — Z3482 Encounter for supervision of other normal pregnancy, second trimester: Secondary | ICD-10-CM

## 2017-01-30 DIAGNOSIS — O09522 Supervision of elderly multigravida, second trimester: Secondary | ICD-10-CM

## 2017-01-30 DIAGNOSIS — Z131 Encounter for screening for diabetes mellitus: Secondary | ICD-10-CM

## 2017-01-30 LAB — POCT URINALYSIS DIPSTICK
Blood, UA: NEGATIVE
Glucose, UA: NEGATIVE
Leukocytes, UA: NEGATIVE
Nitrite, UA: NEGATIVE
Protein, UA: NEGATIVE

## 2017-01-30 NOTE — Patient Instructions (Signed)
Eileen [name], I greatly value your feedback.  If you receive a survey following your visit with us today, we appreciate you taking the time to fill it out.  Thanks, Eileen [name], CNM, WHNP-BC   Call the office [phone]) or go to [facility] if:  You begin to have strong, frequent contractions  Your water breaks.  Sometimes it is a big gush of fluid, sometimes it is just a trickle that keeps getting your panties wet or running down your legs  You have vaginal bleeding.  It is normal to have a small amount of spotting if your cervix was checked.   You don't feel your baby moving like normal.  If you don't, get you something to eat and drink and lay down and focus on feeling your baby move.  You should feel at least 10 movements in 2 hours.  If you don't, you should call the office or go to [facility].    Tdap Vaccine  It is recommended that you get the Tdap vaccine during the third trimester of EACH pregnancy to help protect your baby from getting pertussis (whooping cough)  27-36 weeks is the BEST time to do this so that you can pass the protection on to your baby. During pregnancy is better than after pregnancy, but if you are unable to get it during pregnancy it will be offered at the hospital.   You can get this vaccine at the health department or your family doctor  Everyone who will be around your baby should also be up-to-date on their vaccines. Adults (who are not pregnant) only need 1 dose of Tdap during adulthood.    Tercer trimestre de Psychiatristembarazo (Third Trimester of Pregnancy) El tercer trimestre comprende desde la semana29 hasta la semana42, es decir, desde el mes7 hasta el [address]. En este trimestre, el feto crece muy rpido. Hacia el final del noveno mes, el feto mide alrededor de 20pulgadas (45cm) de largo y pesa entre 6y 10libras [phone](2,700y 4,500kg). CUIDADOS EN EL HOGAR  No fume, no consuma hierbas ni beba alcohol. No tome frmacos que el mdico no  haya autorizado.  No consuma ningn producto que contenga tabaco, lo que incluye cigarrillos, tabaco de Theatre managermascar o Administrator, Civil Servicecigarrillos electrnicos. Si necesita ayuda para dejar de fumar, consulte al American Expressmdico. Puede recibir asesoramiento u otro tipo de apoyo para dejar de fumar.  Tome los medicamentos solamente como se lo haya indicado el mdico. Algunos medicamentos son seguros para tomar durante el Psychiatristembarazo y otros no lo son.  Haga ejercicios solamente como se lo haya indicado el mdico. Interrumpa la actividad fsica si comienza a tener calambres.  Ingiera alimentos saludables de Magas Arribamanera regular.  Use un sostn que le brinde buen soporte si sus mamas estn sensibles.  No se d baos de inmersin en agua caliente, baos turcos ni saunas.  Colquese el cinturn de seguridad cuando conduzca.  No coma carne cruda ni queso sin cocinar; evite el contacto con las bandejas sanitarias de los gatos y la tierra que estos animales usan.  Tome las vitaminas prenatales.  Tome entre 1500 y 2000mg  de calcio diariamente comenzando en la semana20 del embarazo Oyenshasta el parto.  Pruebe tomar un medicamento que la ayude a defecar (un laxante suave) si el mdico lo autoriza. Consuma ms fibra, que se encuentra en las frutas y verduras frescas y los cereales integrales. Beba suficiente lquido para mantener el pis (orina) claro o de color amarillo plido.  Dese baos de asiento con agua tibia para aliviar  el dolor o las molestias causadas por las hemorroides. Use una crema para las hemorroides si el mdico la autoriza.  Si se le hinchan las venas (venas varicosas), use medias de descanso. Levante (eleve) los pies durante 15minutos, 3 o 4veces por Futures traderda. Limite el consumo de sal en su dieta.  No levante objetos pesados, use zapatos de tacones bajos y sintese derecha.  Descanse con las piernas elevadas si tiene calambres o dolor de cintura.  Visite a su dentista si no lo ha Occupational hygienisthecho durante el embarazo. Use un cepillo  de cerdas suaves para cepillarse los dientes. Psese el hilo dental con suavidad.  Puede seguir Calpine Corporationmanteniendo relaciones sexuales, a menos que el mdico le indique lo contrario.  No haga viajes de larga distancia, excepto si es obligatorio y solamente con la aprobacin del mdico.  Tome clases prenatales.  Practique ir manejando al hospital.  Prepare el bolso que llevar al hospital.  Prepare la habitacin del beb.  Concurra a los controles mdicos.  SOLICITE AYUDA SI:  No est segura de si est en trabajo de parto o si ha roto la bolsa de las aguas.  Tiene mareos.  Siente calambres leves o presin en la parte inferior del abdomen.  Sufre un dolor persistente en el abdomen.  Tiene Programme researcher, broadcasting/film/videomalestar estomacal (nuseas), vmitos, o tiene deposiciones acuosas (diarrea).  Advierte un olor ftido que proviene de la vagina.  Siente dolor al ConocoPhillipsorinar.  SOLICITE AYUDA DE INMEDIATO SI:  Tiene fiebre.  Tiene una prdida de lquido por la vagina.  Tiene sangrado o pequeas prdidas vaginales.  Siente dolor intenso o clicos en el abdomen.  Sube o baja de peso rpidamente.  Tiene dificultades para recuperar el aliento y siente dolor en el pecho.  Sbitamente se le hinchan mucho el rostro, las [place] Baymanos, los tobillos, los pies o las piernas.  No ha sentido los movimientos del beb durante Georgianne Fickuna hora.  Siente un dolor de cabeza intenso que no se alivia con medicamentos.  Su visin se modifica.  Esta informacin no tiene Theme park managercomo fin reemplazar el consejo del mdico. Asegrese de hacerle al mdico cualquier pregunta que tenga. Document Released: 10/08/2012 Document Revised: 02/26/2014 Document Reviewed: 04/08/2012 Elsevier Interactive Patient Education  2017 Arvin[employer]itor[employer]vier [employer].

## 2017-01-30 NOTE — Progress Notes (Signed)
   LOW-RISK PREGNANCY VISIT Patient name: Eileen [name] MRN [id number]  Date of birth: February 26, 1980 Chief Complaint:   Routine Prenatal Visit (PN2)  History of Present Illness:   Eileen [name] is a 36 y.o. 392P0010 female at 2279w4d with an Estimated Date of Delivery: 04/27/17 being seen today for ongoing management of a low-risk pregnancy.  Today she reports pain/numbness Lt hand.  Contractions: Not present.  .  Movement: Present. denies leaking of fluid. Review of Systems:   Pertinent items are noted in HPI Denies abnormal vaginal discharge w/ itching/odor/irritation, headaches, visual changes, shortness of breath, chest pain, abdominal pain, severe nausea/vomiting, or problems with urination or bowel movements unless otherwise stated above. Pertinent History Reviewed:  Reviewed past medical,surgical, social, obstetrical and family history.  Reviewed problem list, medications and allergies. Physical Assessment:   Vitals:   01/30/17 0917  BP: 120/80  Pulse: 96  Weight: 236 lb (107 kg)  Body mass index is 44.59 kg/m.        Physical Examination:   General appearance: Well appearing, and in no distress  Mental status: Alert, oriented to person, place, and time  Skin: Warm & dry  Cardiovascular: Normal heart rate noted  Respiratory: Normal respiratory effort, no distress  Abdomen: Soft, gravid, nontender  Pelvic: Cervical exam deferred         Extremities: Edema: Trace  Fetal Status: Fetal Heart Rate (bpm): 159 Fundal Height: 18 cm Movement: Present    Results for orders placed or performed in visit on 01/30/17 (from the past 24 hour(s))  POCT urinalysis dipstick   Collection Time: 01/30/17  9:27 AM  Result Value Ref Range   Color, UA     Clarity, UA     Glucose, UA neg    Bilirubin, UA     Ketones, UA moderate    Spec Grav, UA  1.010 - 1.025   Blood, UA neg    pH, UA  5.0 - 8.0   Protein, UA neg    Urobilinogen, UA  0.2 or 1.0 E.U./dL   Nitrite, UA neg    Leukocytes, UA Negative Negative   Appearance     Odor      Assessment & Plan:  1) Low-risk pregnancy G2P0010 at 5379w4d with an Estimated Date of Delivery: 04/27/17   2) Increased r/f Down Syndrome, 1:193, declined further testing, normal anatomy u/s  3) CTS Lt hand> recommended wrist splint   Labs/procedures today: pn2. Declines flu shot today d/t being self-pay, [facility]  Plan:  Continue routine obstetrical care   Reviewed: Preterm labor symptoms and general obstetric precautions including but not limited to vaginal bleeding, contractions, leaking of fluid and fetal movement were reviewed in detail with the patient. Recommended Tdap at HD/PCP per CDC guidelines.  All questions were answered  Follow-up: Return in about 3 weeks (around 02/20/2017) for LROB.  Orders Placed This Encounter  Procedures  . POCT urinalysis dipstick   Marge DuncansBooker, Jaycie Kregel Randall CNM, [facility]NP[facility]BC 01/30/2017 10:14 AM

## 2017-01-31 LAB — CBC
Hematocrit: 37.3 % (ref 34.0–46.6)
Hemoglobin: 12.5 g/dL (ref 11.1–15.9)
MCH: 30.8 pg (ref 26.6–33.0)
MCHC: 33.5 g/dL (ref 31.5–35.7)
MCV: 92 fL (ref 79–97)
Platelets: 262 10*3/uL (ref 150–379)
RBC: 4.06 x10E6/uL (ref 3.77–5.28)
RDW: 14.1 % (ref 12.3–15.4)
WBC: 7.1 10*3/uL (ref 3.4–10.8)

## 2017-01-31 LAB — GLUCOSE TOLERANCE, 2 HOURS W/ 1HR
GLUCOSE, 1 HOUR: 194 mg/dL — AB (ref 65–179)
GLUCOSE, FASTING: 86 mg/dL (ref 65–91)
Glucose, 2 hour: 121 mg/dL (ref 65–152)

## 2017-01-31 LAB — RPR: RPR: NONREACTIVE

## 2017-01-31 LAB — HIV ANTIBODY (ROUTINE TESTING W REFLEX): HIV Screen 4th Generation wRfx: NONREACTIVE

## 2017-01-31 LAB — ANTIBODY SCREEN: ANTIBODY SCREEN: NEGATIVE

## 2017-02-19 NOTE — L&D Delivery Note (Signed)
Delivery Note At 1:58 AM a viable female was delivered via Vaginal, Spontaneous (Presentation: ; LOA ).  APGAR: 8, 9; weight  Pending.  Unsure if Down Sydrome (nose/creases all look normal, but eyes?  Would lean towards no, will defer to Nursery).   After 1 minute, the cord was clamped and cut. 40 units of pitocin diluted in 1000cc LR was infused rapidly IV.  The placenta separated spontaneously and delivered via CCT and maternal pushing effort.  It was inspected and appears to be intact with a 3 VC Anesthesia:  epidural Episiotomy: None Lacerations: None Suture Repair:  Est. Blood Loss (mL): 100   Mom to postpartum.  Baby to Couplet care / Skin to Skin. Continue MgSO4 for 24 h ours  Eileen [name] 04/19/2017, 2:14 AM  .     Please schedule this patient for PP visit in: 1 week  High risk pregnancy complicated by: Diabetes A1 and preeclampsia, severe  Delivery mode: SVD  Anticipated Birth Control: other/unsure  PP Procedures needed: BP check  Schedule Integrated BH visit: no  Provider: Any provider

## 2017-02-20 ENCOUNTER — Encounter: Payer: Self-pay | Admitting: Women's Health

## 2017-02-20 ENCOUNTER — Encounter: Payer: Self-pay | Admitting: Obstetrics and Gynecology

## 2017-02-20 ENCOUNTER — Ambulatory Visit (INDEPENDENT_AMBULATORY_CARE_PROVIDER_SITE_OTHER): Payer: Self-pay | Admitting: Women's Health

## 2017-02-20 VITALS — BP 122/70 | HR 95 | Wt 241.0 lb

## 2017-02-20 DIAGNOSIS — O24410 Gestational diabetes mellitus in pregnancy, diet controlled: Secondary | ICD-10-CM

## 2017-02-20 DIAGNOSIS — O09523 Supervision of elderly multigravida, third trimester: Secondary | ICD-10-CM

## 2017-02-20 DIAGNOSIS — Z1389 Encounter for screening for other disorder: Secondary | ICD-10-CM

## 2017-02-20 DIAGNOSIS — Z3483 Encounter for supervision of other normal pregnancy, third trimester: Secondary | ICD-10-CM

## 2017-02-20 DIAGNOSIS — Z3A30 30 weeks gestation of pregnancy: Secondary | ICD-10-CM

## 2017-02-20 DIAGNOSIS — Z331 Pregnant state, incidental: Secondary | ICD-10-CM

## 2017-02-20 DIAGNOSIS — O0993 Supervision of high risk pregnancy, unspecified, third trimester: Secondary | ICD-10-CM

## 2017-02-20 LAB — POCT URINALYSIS DIPSTICK
Glucose, UA: NEGATIVE
KETONES UA: NEGATIVE
NITRITE UA: NEGATIVE
Protein, UA: NEGATIVE
RBC UA: NEGATIVE

## 2017-02-20 NOTE — Progress Notes (Signed)
   HIGH-RISK PREGNANCY VISIT Patient name: Eileen MolaDeysi Palacios-Benitez MRN [id number]  Date of birth: Sep 02, 1980 Chief Complaint:   Routine Prenatal Visit  History of Present Illness:   [name] Palacios-Benitez is a 37 y.o. 222P0010 female at 757w4d with an Estimated Date of Delivery: 04/27/17 being seen today for ongoing management of a high-risk pregnancy complicated by A1DM, pt notified today.  Today she reports no complaints. Contractions: Not present.  .  Movement: Present. denies leaking of fluid.  Review of Systems:   Pertinent items are noted in HPI Denies abnormal vaginal discharge w/ itching/odor/irritation, headaches, visual changes, shortness of breath, chest pain, abdominal pain, severe nausea/vomiting, or problems with urination or bowel movements unless otherwise stated above. Pertinent History Reviewed:  Reviewed past medical,surgical, social, obstetrical and family history.  Reviewed problem list, medications and allergies. Physical Assessment:   Vitals:   02/20/17 0831  BP: 122/70  Pulse: 95  Weight: 241 lb (109.3 kg)  Body mass index is 45.54 kg/m.           Physical Examination:   General appearance: alert, well appearing, and in no distress  Mental status: alert, oriented to person, place, and time  Skin: warm & dry   Extremities: Edema: Trace    Cardiovascular: normal heart rate noted  Respiratory: normal respiratory effort, no distress  Abdomen: gravid, soft, non-tender  Pelvic: Cervical exam deferred         Fetal Status: Fetal Heart Rate (bpm): 155 Fundal Height: 22 cm Movement: Present    Fetal Surveillance Testing today: FHT doppler   Results for orders placed or performed in visit on 02/20/17 (from the past 24 hour(s))  POCT urinalysis dipstick   Collection Time: 02/20/17  8:40 AM  Result Value Ref Range   Color, UA     Clarity, UA     Glucose, UA neg    Bilirubin, UA     Ketones, UA neg    Spec Grav, UA  1.010 - 1.025   Blood, UA neg    pH, UA  5.0 -  8.0   Protein, UA neg    Urobilinogen, UA  0.2 or 1.0 E.U./dL   Nitrite, UA neg    Leukocytes, UA Moderate (2+) (A) Negative   Appearance     Odor      Assessment & Plan:  1) High-risk pregnancy G2P0010 at 6457w4d with an Estimated Date of Delivery: 04/27/17   2) A1DM, notified today, dietician referral placed- let us know if doesn't hear from them w/in 1wk. Is self-pay, so if has trouble getting supplies [facility]. Reviewed QID testing/goals, bring log to each visit. Discussed decreasing carbs/increasing protein. Discussed importance of strict glycemic control and adherence to low carb diet during pregnancy as well as potential complications from uncontrolled diabetes during pregnancy.  3) Increased r/f T21, 1:193, declined further testing, normal anatomy u/s  4) AMA  Labs/procedures today: none  Treatment Plan:  Growth u/s @ 36-38wks     Deliver @ 40wks:____   Reviewed: Preterm labor symptoms and general obstetric precautions including but not limited to vaginal bleeding, contractions, leaking of fluid and fetal movement were reviewed in detail with the patient.  All questions were answered.  Follow-up: Return in about 2 weeks (around 03/06/2017) for HROB.  Orders Placed This Encounter  Procedures  . Referral to Nutrition and Diabetes Services  . POCT urinalysis dipstick   Marge DuncansBooker, Galvin Aversa Randall CNM, [facility]NP-BC 02/20/2017 9:11 AM

## 2017-02-20 NOTE — Patient Instructions (Signed)
Check blood sugars 4 times a day: in the morning before eating/drinking anything (<90) and 2 hours after eating breakfast, lunch, and supper (<120).    Eileen [name], I greatly value your feedback.  If you receive a survey following your visit with [place] today, we appreciate you taking the time to fill it out.  Thanks, Eileen [name], CNM, WHNP-BC   Call the office [phone]) or go to Eileen [name] if:  You begin to have strong, frequent contractions  Your water breaks.  Sometimes it is a big gush of fluid, sometimes it is just a trickle that keeps getting your panties wet or running down your legs  You have vaginal bleeding.  It is normal to have a small amount of spotting if your cervix was checked.   You don't feel your baby moving like normal.  If you don't, get you something to eat and drink and lay down and focus on feeling your baby move.  You should feel at least 10 movements in 2 hours.  If you don't, you should call the office or go to Eileen [name] diabetes mellitus gestacional Gestational Diabetes Mellitus, Diagnosis La diabetes gestacional (diabetes mellitus gestacional) es una forma temporal de diabetes que algunas mujeres desarrollan durante el Media planner. Generalmente, ocurre alrededor Eileen [name] 24 a la 28de gestacin y desaparece despus del parto. Los cambios hormonales durante el embarazo pueden interferir en la produccin y la actividad de la Eileen [name], lo que puede derivar en uno de estos problemas o en ambos:  El pncreas no produce la cantidad suficiente de una hormona llamada insulina.  Las clulas del cuerpo no responden de Eileen [name] a la insulina que el organismo produce (resistencia a la insulina).  Normalmente, la insulina estimula el ingreso de la glucosa en las clulas del cuerpo. Las clulas usan la glucosa para Eileen [name]. La resistencia a la insulina o la falta de esta hormona hace que el exceso de glucosa se acumule en  la sangre, en lugar de ir a las clulas. Como resultado, aumenta la glucemia (hiperglucemia). Cules son los riesgos? Si la diabetes gestacional se trata, hay pocas probabilidades de que ocasione problemas. Si no se la controla con tratamiento, puede causar problemas durante el Eileen [name] de parto y Eileen [name], y algunos de esos problemas pueden ser dainos para el beb en gestacin (feto) y Eileen [name], nutritional. La diabetes gestacional que no se controla tambin puede producir problemas respiratorios y bajo nivel de glucemia en el recin nacido. Las mujeres que tienen diabetes gestacional son ms propensas a [name] afeccin si se embarazan de nuevo y a tener diabetes tipo2 en el futuro. Qu incrementa el riesgo? Es ms probable que [name] en las mujeres embarazadas que:  Son [name] de 25aos durante el [place].  Tienen antecedentes familiares de diabetes.  Tienen sobrepeso.  Tuvieron diabetes gestacional en el pasado.  Tienen sndrome del ovario poliqustico (SOP).  Tienen un embarazo gemelar o un embarazo mltiple.  Son descendientes de indgenas norteamericanos, afroamericanos, hispanos o latinos, o asiticos o isleos del Pacfico.  Cules son los signos o los sntomas? La mayora de las mujeres no perciben los sntomas de la diabetes gestacional porque son similares a los sntomas normales del Media planner. Los sntomas de la diabetes gestacional pueden incluir, entre otros:  Aumento de la sed (polidipsia).  Aumento del apetito (polifagia).  Aumento de la miccin (poliuria).  Cmo se diagnostica?  Esta afeccin se puede diagnosticar en funcin  del nivel de glucemia que puede medirse con uno o ms de los siguientes anlisis de sangre:  Medicin de la glucemia en [place]. No se le permitir comer (tendr que [employer]) durante al menos 8horas antes de que se tome una [place] de [place].  Prueba al azar de la glucemia. Esta prueba mide la glucemia en cualquier  momento del da, sin importar cundo comi.  Prueba de tolerancia a la glucosa oral (PTGO). Generalmente, se realiza [place] [employer] 24 a la 28de gestacin. ? Para esta prueba, le harn una medicin de la glucemia en ayunas. Luego, tomar una bebida que contiene glucosa. Se analizar el nivel de glucemia nuevamente 1hora despus de tomar la bebida con glucosa (PTGO a la hora). ? Si el resultado de la PTGO a la hora es igual o superior a 130m/dl (7,834ml/l), le repetirn la PTGO. Esta vez, se analizar el nivel de glucemia 3horas despus de tomar la bebida con glucosa (PTGO a las 3horas).  Si tiene factores de riJulesburgpueden hacerle pruebas de deteccin de la diabetes tipo2 no diagnosticada en la primera visita de atencin mdMedical sales representativevisita prenatal). Cmo se trata?  El tratamiento puede estar a cargo de unResearch officer, trade unionPara tratar esta afeccin, debe seguir las indicaciones del mdico respecto de lo siguiente:  Comer una dieta ms saludable y aumentar la actividad fsica. Estos cambios son lo ms importante para mantener la diabetes gestacional bajo control.  Controlarse la glucemia. Hgalo con la frecuencia que le hayan indicado.  Tomar los medicamentos para la diabetes o aplicarse la inSanmina-SCIEstos frmacos se recetarn solamente si son necesarios. ? Si usCanadansulina, tal vez deba ajustar las dosis en funcin de la cantidad de actividad fsica que realiza y de los alimentos que consume. El mdico le indicar cmo hacerlo.  El mdico esAllstatebjetivos del tratamiento para usted sobre la base de la etapa del emMedia plannern la que se encuentra y de cualquier otra afeccin que padezca. Generalmente, el objetivo del tratamiento es maFamily Dollar Storesiguientes niveles de glucemia durante el embarazo:  En ayunas: igual o menor que 951ml (5,3mm37ml).  Despus de las comidas (posprandial): ? Una hora despus de una comida: igual o  menor que 140mg78m(7,8mmol6m. ? Dos horas despus de una comida: igual o menor que 120mg/d19m,7mmol/l30m Nivel de A1c (hemoglobinaA1c): del 6% al 6,5%.  Siga estas instrucciones en su casa:  Tome los medicamentos de venta libre y los recetados solamente como se lo haya indicado el mdico.  Controle el aumento de peso durante el embarazoDeeringtidad de peso queWashington Mutualra que aumente depende de su IMC (ndiSiskiyoude masa corporal) antes del embarazo.  Concurra a todas las visitas de control como se lo haya indicado el mdico. Esto es importante. Piense en hacerle las siguientes preguntas al mdico:   Debo reunirme con un instrRadio broadcast assistant cuidado de la diabetes?  Dnde puedo encontrar un grupo de apoyo para personas diabticas?  Qu equipos necesitar para controlar la diabetes en casa?  Qu medicamentos para la diabetes necesito y cundo debo tomarlos?  Con qu frecuencia debo controlarme la glucemia?  A qu nmero puedo llamar si tengo preguntas?  Cundo es mi prxima cita? Dnde encontrar ms informacin:  Para obtener ms informacin sobre la diabetes, visite los siguientes sitios: ? Asociacin Estadounidense de la Diabetes (American Diabetes Association, ADA): www.diabetes.org ? Asociacin Estadounidense de Instructores para el Cuidado de la Diabetes (American Association  of Diabetes Educators, AADE): www.diabeteseducator.org/patient-resources Comunquese con un mdico si:  El nivel de glucemia es igual o superior a 274m/dl (13,316ml/l).  El nivel de glucemia es igual o superior a 20014ml (11,1mm21ml), y tiene cetonas en la orina.  Ha estado enfermo o ha tenido fiebre durante 2o ms das y no mejoMorganiene alguno de los siguientes problemas durante ms de 6horas: ? No puede comer ni beber. ? Tiene nuseas y vmitos. ? Tiene diarrea. Solicite ayuda de inmediato si:  Su nivel de glucemia est por debajo de 54mg49m(3mmol89m.  Est confundido  o tiene dificultad para pensar con claridad.  Tiene dificultad para respirar.  Tiene un nivel moderado o alto de cetonas en la orina.Simlabeb se mueve menos de lo habitual.  Tiene secreciones inusuales o sangrado de la vagina.  Comienza a tener contracciones antes de tiempo (prematuramente). Las contracciones se pueden sentir como un endurecimiento de la parte inferior del abdomen. Esta informacin no tiene como fMarine scientistnsejo del mdico. Asegrese de hacerle al mdico cualquier pregunta que tenga.   Diabetes mellitus gestacional, cuidados personales Gestational Diabetes Mellitus, Self Care El cuidado personal despus del diagnstico de diabetes gestacional (diabetes mellitus gestacional) implica mantener el nivel de glucosa en la sangre bajo control a travs del equilibrio de los siguientes factores:  Nutricin.  Actividad fsica.  Cambios en el estilo de vida.  Medicamentos o insulina, si es necesario.  El apoyo del equipo de mdicos y de otras Producer, television/film/videosiguiente informacin explica lo que debe saber para mantener la diabetes gestacional bajo control en su casa. Qu debo saber para mantener la glucemia bajo control?  ContrlAltmar esto con la frecuencia que le haya indicado el mdico.  Comunquese con el mdico si la glucemia est por encima del nivel ideal en 2anlisis seguidos. El mdico Genworth Financialivos personalizados de su tratamiento. Generalmente, el objetivo del tratamiento es mantenFamily Dollar Storesentes niveles de glucemia durante el embarazo:  Despus de no haber comido durante 8horas (despus de ayunar): igual o menor que 95mg/d47m,3mmol/l76m Despus de las comidas (posprandial): ? Una hora despus de una comida: igual o menor que 140mg/dl 7mmmol/l).43mDos horas despus de una comida: igual o menor que 120mg/dl (677mol/l).  46mel de A1c (hemoglobinaA1c): del 6% al  6,5%.  Qu debo saber sobre la hiperglucemia y la hipoglucemia? Qu es la hiperglucemia? La hiperglucemia, tambin llamada glucemia alta, ocurre cuando el nivel de glucosa en la sangre es muy elevado.[place] conocer los Ryland Groupranos de hiperglucemia, por ejemplo:  Aumento de la sed.  Hambre.  Mucho cansancio.  Necesidad de orinar con mGarment/textile technologistrecuencia que lo habitual.  Visin borrosa.  Qu es la hipoglucemia? La hipoglucemia, tambin llamada glucemia baja, ocurre cuando el nivel de glucosa en la sangre es igual o menor que 70mg/dl (3,95m/l). El 12msgo de hipoglucemia aumenta durante o despus de realizar activOptometristca, mientras duerme, cuando est enfermo o si se saltea comidas o no come durante mucho Tech Data Corporationmportante conPhotographere la hipoglucemia y tratarla de inmediato. Siempre lleve consigo una colacin de 15g de hidratos de carbono de accin rpida para tratar los niveles bajos de glucemia.Los familiares y los amigos cercanos tambin deben conocer los snRyland Groupmprender cmo tratar la hipoglucemia, en caso de que usted no pueda tratarse a s mismo. Cules son los sntomas de la hipoglucemia? Los sntomas de hipoglucemia son:  Hambre.  Ansiedad.  Sudoracin y piel hmeda.  Confusin.  Mareos o sensacin de desvanecimiento.  Somnolencia.  Nuseas.  Aumento de la frecuencia cardaca.  Dolor de [place].  Visin borrosa.  Convulsiones.  Pesadillas.  Hormigueo o adormecimiento alrededor Exxon [employer]il Corporation, los labios o la [place].  Cambios en el habla.  Disminucin de la capacidad de concentracin.  Cambios en la coordinacin.  Sueo agitado.  Temblores o sacudidas.  Desmayos.  Irritabilidad.  Cmo se trata la hipoglucemia?  Si est alerta y puede tragar con seguridad, siga la regla de 15/15, que consiste en lo siguiente:  Tome 15gramos de hidratos de carbono de accin rpida. Las opciones de accin rpida  incluyen lo siguiente: ? 1pomo de glucosa en gel. ? 3comprimidos de glucosa. ? 6 a 8unidades de caramelos duros. ? 4onzas (137m) de jugo de frutas. ? 4onzas (1276m de gaseosa comn (no diettica).  Contrlese la glucemia 1574mtos despus de ingerir el hidrato de carbono.  Si este nuevo nivel de glucemia an es de 55m21m o menoGarment/textile technologist9mmo65m), vuelva a ingerir 15gramos de un hidrato de carbono.  Si la glucemia no aumenta por encima de 55mg/83m3,9mmol/8mdespus de 3intentos, solicite ayuda mdica de emergencia.  Ingiera una comida o una colacin en el transcurso de 1hora despus de que la glucemia se haya normalizado.  Cmo se trata la hipoglucemia grave? La hipoglucemia grave ocurre cuando la glucemia es igual o menor que 54mg/dl81mmol/l)14ma hipoglucemia grave es una emergEngineer, maintenance (IT)re hasta que los sntomas desaparezcan. Solicite atencin mdica de inmediato. Comunquese con el servicio de emergencias de su localidad (911 en los [place]). No conduzca por sus propios medios hasta el Principal Financiale hipoglucemia grave y no puede ingerir alimentos o bebidas, tal vez deba aplicarse una inyeccin de glucagn. Un familiar o un amigo cercano deben aprender a controlarle la glucemia y a aplicarle una inyeccin de glucagn. Pregntele al mdico si debe tener disponible un kit de inyecciones de glucagn de emergenciFreight forwarderble que la hipoglucemia grave deba tratarse en un hospital. El tratamiento puede incluir la administracin de glucosa a travs de un tubo (catter) intravenoso. Tambin puede necesitar un tratamiento para tratar la afeccin que est causando la hipoglucemia. Qu otras cosas puedo hacer para controlar la diabetes gestacional? Tome los medicamentos para la diabetes como se lo hayan indicado  Si el mdico le recet insulina o medicamentos para la diabetes, tmelos todos losUS Airwaysquede sin insulina ni cualquier otro medicamento para la  diabetes que tome. Planifique con antelacin para tenerlos siempre a su disposicin.  Si usa insulCanada, ajuste las dosis en funcin de la cantidad de actividad fsica que realiza y de los alimentos que consume. El mdico le indicar cmo ajustar las dosis. Opte por opciones de alimentos saludables  Lo que come y bebe incide en la glucemia. Hacer buenas elecciones ayuda a mantener la diabetes bajo control y a evitar otTax inspector. Un plan de alimentacin saludable incluye consumir protenas magras, hidratos de carbono complejos, frutas y verduras frescas, productos lcteos con bajo contenido de grasa y gDjibouti saludables. Programe una cita con un especialista en alimentacin y nutricin (nutricionista certificado) para que lo ayude a armar un Journalist, newspaperntacin adecuado para usted. Asegrese de lo siguiente:  Siga las indicaciones del mdico respecto de las restricciones para las comidas o las bebidas.  Beba suficiente lquido para mantener Consulting civil engineerara o de color amarillo plido.  Ingiera colaciones saludables entre  comidas nutritivas.  Haga un seguimiento de los hidratos de carbono que consume. Para hacerlo, lea las etiquetas de informacin nutricional y aprenda cules son los tamaos de las porciones estndar de los alimentos.  Siga el plan para los das de enfermedad cuando no pueda comer o beber normalmente. Arme este plan por adelantado con el Nora 39mnutos de actividad fsica por dTraining and development officer o tanta actividad fsica como le recomiende el mdico durante el eLa Loma [place] ? Hacer 142mutos de actividad fsica 30103mtos despus de cada comida puede ayudar a conPilgrim's Pride glucemia posprandial.  Si comienza un ejercicio o una actividad nuevos, trabaje con el mdico para ajustar la insulina, los medicamentos o la ingesta de comidas segn sea necesario. Opte por un estilo de vida saludable  No beba alcohol.  No consuma ningn producto  que contenga tabaco, lo que incluye cigarrillos, tabaco de masHigher education careers advisercigPsychologist, sport and exercisei necesita ayuda para dejar de fumar, consulte al mdico.  Aprenda a manEngineer, maintenance (IT)i necesita ayuda para lograrlo, consulte a su mdico. Cuide su cuerpo  Mantngase al da con las vacunas.  Cepllese los dientes y lasGulfcrestuse hilo dental al menos una vez por da.  Visite al dentista al menos una vez cada 6me11m.  Mantenga un peso saluTax adviserstrucciones generales   TomeDelphiventa libre y los recetados solamente como se lo haya indicado el mdico.  Hable con el mdico sobre el riesgo de tener hipertensin arterial durante el embarazo (preeclampsia o eclampsia).  Comparta su plan de control de la diabetes con sus compaeros de trabajo y de la eCytogeneticistcon las personas con las que convEast Yorkontrole el nivel de cetonas en la orina durante el embarazo cuando est enferma o como se lo haya indicado el mdico.  Lleve una tarjeta de alerta mdica o use un brazalete o medalla de alerta mdica.  Pregntele al mdico: ? Debo reunirme con un iRadio broadcast assistanta el cuidado de la diabetes? ? Dnde puedo encontrar un grupo de apoyo para personas diabticas?  Asista a todas las visitas de control durante el embarazo (prenatal) y despus del parto (posnatal) como se lo haya indicado el mdico. Esto es importante. Procure recibir la atencin que necesita despus del parto  Hgase controlar la glucemia de 4a 12semanas despus del parto. Esto se hace con una prueba de tolerancia a la glucosa oral (PTGO).  Hgase controles de deteccin de la diabetes al menos cada 3aos59aoson la frecuencia que le haya indicado el mdico. Dnde encontrar ms informacin: Para obtener ms informacin sobre la diabetes gestacional, visite los siguientes sitios:  Asociacin Americana de la Diabetes (American Diabetes Association, ADA):  www.diabetes.org/diabetes-basics/gestational  Centros para el CBuilding surveyora Prevencin de EnfeProbation officer Disease Control and Prevention, CDC): www.http://sanchez-watson.com/  Esta informacin no tiene comoMarine scientistconsejo del mdico. Asegrese de hacerle al mdico cualquier pregunta que tenga. Document Released: 05/30/2015 Document Revised: 05/11/2016 Document Reviewed: 03/11/2015 Elsevier Interactive Patient Education  2018 ElseOcillaeased: 11/15/2004 Document Revised: 05/11/2016 Document Reviewed: 03/11/2015 Elsevier Interactive Patient Education  2018 ElseReynolds American

## 2017-03-06 ENCOUNTER — Encounter: Payer: Self-pay | Admitting: Registered"

## 2017-03-06 ENCOUNTER — Ambulatory Visit (INDEPENDENT_AMBULATORY_CARE_PROVIDER_SITE_OTHER): Payer: Self-pay | Admitting: Obstetrics and Gynecology

## 2017-03-06 ENCOUNTER — Encounter: Payer: Self-pay | Attending: Obstetrics and Gynecology | Admitting: Registered"

## 2017-03-06 ENCOUNTER — Other Ambulatory Visit: Payer: Self-pay

## 2017-03-06 ENCOUNTER — Encounter: Payer: Self-pay | Admitting: Obstetrics and Gynecology

## 2017-03-06 VITALS — BP 134/76 | HR 87 | Wt 245.0 lb

## 2017-03-06 DIAGNOSIS — Z3A32 32 weeks gestation of pregnancy: Secondary | ICD-10-CM

## 2017-03-06 DIAGNOSIS — O0993 Supervision of high risk pregnancy, unspecified, third trimester: Secondary | ICD-10-CM

## 2017-03-06 DIAGNOSIS — O24410 Gestational diabetes mellitus in pregnancy, diet controlled: Secondary | ICD-10-CM

## 2017-03-06 DIAGNOSIS — O09523 Supervision of elderly multigravida, third trimester: Secondary | ICD-10-CM

## 2017-03-06 DIAGNOSIS — Z1389 Encounter for screening for other disorder: Secondary | ICD-10-CM

## 2017-03-06 DIAGNOSIS — Z331 Pregnant state, incidental: Secondary | ICD-10-CM

## 2017-03-06 LAB — POCT URINALYSIS DIPSTICK
Blood, UA: NEGATIVE
Glucose, UA: NEGATIVE
KETONES UA: NEGATIVE
Nitrite, UA: NEGATIVE
PROTEIN UA: NEGATIVE

## 2017-03-06 NOTE — Progress Notes (Signed)
Patient ID: Eileen [name], female   DOB: August 30, 1980, 37 y.o.   MRN: [id number]   [facility] Patient name: Eileen MolaDeysi [name] MRN [id number]  Date of birth: August 30, 1980 Chief Complaint:   High Risk Gestation  History of Present Illness:   [name] is a 37 y.o. 452P0010 female at 4444w4d with an Estimated Date of Delivery: 04/27/17 being seen today for ongoing management of a high-risk pregnancy complicated by A1DM.  Today she reports no complaints. She has not been checking her blood sugars at home. She states she has an appointment with a specialist, diabetic educator, later today. She has not been eating better either at this time. Contractions: Not present. Vag. Bleeding: None.  Movement: Present. denies leaking of fluid.  Review of Systems:   Pertinent items are noted in HPI Denies abnormal vaginal discharge w/ itching/odor/irritation, headaches, visual changes, shortness of breath, chest pain, abdominal pain, severe nausea/vomiting, or problems with urination or bowel movements unless otherwise stated above. Pertinent History Reviewed:  Reviewed past medical,surgical, social, obstetrical and family history.  Reviewed problem list, medications and allergies. Physical Assessment:   Vitals:   03/06/17 0846  BP: 134/76  Pulse: 87  Weight: 245 lb (111.1 kg)  Body mass index is 46.29 kg/m.           Physical Examination:   General appearance: alert, well appearing, and in no distress and oriented to person, place, and time  Mental status: alert, oriented to person, place, and time, normal mood, behavior, speech, dress, motor activity, and thought processes, affect appropriate to mood  Skin: warm & dry   Extremities: Edema: Trace    Cardiovascular: normal heart rate noted  Respiratory: normal respiratory effort, no distress  Abdomen: gravid, soft, non-tender  Pelvic: Cervical exam deferred         Fetal Status: Fetal Heart Rate (bpm): 126 Fundal  Height: 38 cm Movement: Present    Fetal Surveillance Testing today:   Results for orders placed or performed in visit on 03/06/17 (from the past 24 hour(s))  POCT urinalysis dipstick   Collection Time: 03/06/17  8:51 AM  Result Value Ref Range   Color, UA     Clarity, UA     Glucose, UA neg    Bilirubin, UA     Ketones, UA neg    Spec Grav, UA  1.010 - 1.025   Blood, UA neg    pH, UA  5.0 - 8.0   Protein, UA neg    Urobilinogen, UA  0.2 or 1.0 E.U./dL   Nitrite, UA neg    Leukocytes, UA Moderate (2+) (A) Negative   Appearance     Odor      Assessment & Plan:  1) High-risk pregnancy G2P0010 at 5944w4d with an Estimated Date of Delivery: 04/27/17   2) A1DM, stable  3) AMA   Meds: No orders of the defined types were placed in this encounter.   Labs/procedures today:   Treatment Plan:  Growth u/s @ 36-38wks     Deliver @ 40wks Discussed the importance of monitoring her sugars and what she eats. Seeing diabetic educator today.  Follow-up: Return in about 1 week (around 03/13/2017) for HROB.  Orders Placed This Encounter  Procedures  . POCT urinalysis dipstick   By signing my name below, I, Diona BrownerJennifer Gorman, attest that this documentation has been prepared under the direction and in the presence of Tilda BurrowFerguson, Jayleen Afonso V, MD. Electronically Signed: Diona BrownerJennifer Gorman, Medical Scribe. 03/06/17. 9:22 AM.  I personally  performed the services described in this documentation, which was SCRIBED in my presence. The recorded information has been reviewed and considered accurate. It has been edited as necessary during review. Jonnie Kind, MD

## 2017-03-06 NOTE — Patient Instructions (Addendum)
Consider asking to have your Vitamin D check  Use smoothie recipes for lower carbohydrate, higher protein breakfast ideas

## 2017-03-06 NOTE — Progress Notes (Signed)
Patient was seen on 03/06/2017 for Gestational Diabetes self-management class [facility]. The following learning objectives were met by the patient during this course:   States the definition of Gestational Diabetes  States why dietary management is important in controlling blood glucose  Describes the effects each nutrient has on blood glucose levels  Demonstrates ability to create a balanced meal plan  Demonstrates carbohydrate counting   States when to check blood glucose levels  Demonstrates proper blood glucose monitoring techniques  States the effect of stress and exercise on blood glucose levels  States the importance of limiting caffeine and abstaining from alcohol and smoking  Blood glucose monitor given: Accu-Chek Guide Lot # [id number] Exp: 04-14-2018 Blood glucose reading: 98  Patient instructed to monitor glucose levels: FBS: 60 - <95 1 hour: <140 2 hour: <120  Patient received handouts:  Nutrition Diabetes and Pregnancy  Carbohydrate Counting List  Patient will be seen for follow-up as needed.

## 2017-03-14 ENCOUNTER — Ambulatory Visit (INDEPENDENT_AMBULATORY_CARE_PROVIDER_SITE_OTHER): Payer: Self-pay | Admitting: Obstetrics and Gynecology

## 2017-03-14 ENCOUNTER — Encounter: Payer: Self-pay | Admitting: Obstetrics and Gynecology

## 2017-03-14 VITALS — BP 130/80 | HR 97 | Wt 244.2 lb

## 2017-03-14 DIAGNOSIS — O09523 Supervision of elderly multigravida, third trimester: Secondary | ICD-10-CM

## 2017-03-14 DIAGNOSIS — O0990 Supervision of high risk pregnancy, unspecified, unspecified trimester: Secondary | ICD-10-CM

## 2017-03-14 DIAGNOSIS — Z1389 Encounter for screening for other disorder: Secondary | ICD-10-CM

## 2017-03-14 DIAGNOSIS — Z331 Pregnant state, incidental: Secondary | ICD-10-CM

## 2017-03-14 DIAGNOSIS — O24410 Gestational diabetes mellitus in pregnancy, diet controlled: Secondary | ICD-10-CM

## 2017-03-14 DIAGNOSIS — Z3A33 33 weeks gestation of pregnancy: Secondary | ICD-10-CM

## 2017-03-14 LAB — POCT URINALYSIS DIPSTICK
Blood, UA: NEGATIVE
Glucose, UA: NEGATIVE
Nitrite, UA: NEGATIVE
Protein, UA: NEGATIVE

## 2017-03-14 NOTE — Progress Notes (Signed)
HIGH-RISK PREGNANCY VISIT Patient name: Eileen [name] MRN [id number]  Date of birth: [birth year] Chief Complaint:   High Risk Gestation  History of Present Illness:   Eileen [name] is a 37 y.o. 262P0010 female at 5236w5d with an Estimated Date of Delivery: 04/27/17 being seen today for ongoing management of a high-risk pregnancy complicated by gestational DM, class  A1 DM.   Today she reports no complaints. She has brought her list of blood sugars. FBS is 120 (drank a soda), 90-96. During the day, only one blood sugar above 120.    Contractions: Not present.  .  Movement: Present. denies leaking of fluid.  Review of Systems:   Pertinent items are noted in HPI Denies abnormal vaginal discharge w/ itching/odor/irritation, headaches, visual changes, shortness of breath, chest pain, abdominal pain, severe nausea/vomiting, or problems with urination or bowel movements unless otherwise stated above. Pertinent History Reviewed:  Reviewed past medical,surgical, social, obstetrical and family history.  Reviewed problem list, medications and allergies. Physical Assessment:   Vitals:   03/14/17 0829  BP: 130/80  Pulse: 97  Weight: 244 lb 3.2 oz (110.8 kg)  Body mass index is 46.14 kg/m.           Physical Examination:   General appearance: alert, well appearing, and in no distress  Mental status: alert, oriented to person, place, and time  Skin: warm & dry   Extremities: Edema: None    Cardiovascular: normal heart rate noted  Respiratory: normal respiratory effort, no distress  Abdomen: gravid, soft, non-tender  Pelvic: Cervical exam deferred         Fetal Status: Fetal Heart Rate (bpm): 138 Fundal Height: 38 cm U+18 Movement: Present    Fetal Surveillance Testing today: Doppler   Results for orders placed or performed in visit on 03/14/17 (from the past 24 hour(s))  POCT urinalysis dipstick   Collection Time: 03/14/17  8:36 AM  Result Value Ref Range   Color, UA     Clarity, UA     Glucose, UA neg    Bilirubin, UA     Ketones, UA small    Spec Grav, UA  1.010 - 1.025   Blood, UA neg    pH, UA  5.0 - 8.0   Protein, UA neg    Urobilinogen, UA  0.2 or 1.0 E.U./dL   Nitrite, UA neg    Leukocytes, UA Moderate (2+) (A) Negative   Appearance     Odor      Assessment & Plan:  1) High-risk pregnancy G2P0010 at 3336w5d with an Estimated Date of Delivery: 04/27/17   2) A1DM, stable  3) AMA  Meds: No orders of the defined types were placed in this encounter.   Labs/procedures today: Doppler  Treatment Plan:  Growth u/s @ 36-38wks Deliver @ 40wks. Pt advised to increase exercise.    Reviewed: Preterm labor symptoms and general obstetric precautions including but not limited to vaginal bleeding, contractions, leaking of fluid and fetal movement were reviewed in detail with the patient.  All questions were answered.  Follow-up: Return in about 1 week (around 03/21/2017) for HROB.  Orders Placed This Encounter  Procedures  . POCT urinalysis dipstick    By signing my name below, I, Izna Ahmed, attest that this documentation has been prepared under the direction and in the presence of Tilda BurrowFerguson, Amybeth Sieg V, MD. Electronically Signed: Redge GainerIzna Ahmed, Medical Scribe. 03/14/17. 9:16 AM.  I personally performed the services described in this documentation, which was SCRIBED in my  presence. The recorded information has been reviewed and considered accurate. It has been edited as necessary during review. Jonnie Kind, MD

## 2017-03-27 ENCOUNTER — Ambulatory Visit (INDEPENDENT_AMBULATORY_CARE_PROVIDER_SITE_OTHER): Payer: Self-pay | Admitting: Obstetrics and Gynecology

## 2017-03-27 VITALS — BP 134/80 | HR 86 | Wt 241.0 lb

## 2017-03-27 DIAGNOSIS — O24410 Gestational diabetes mellitus in pregnancy, diet controlled: Secondary | ICD-10-CM

## 2017-03-27 DIAGNOSIS — Z331 Pregnant state, incidental: Secondary | ICD-10-CM

## 2017-03-27 DIAGNOSIS — Z1389 Encounter for screening for other disorder: Secondary | ICD-10-CM

## 2017-03-27 DIAGNOSIS — O0993 Supervision of high risk pregnancy, unspecified, third trimester: Secondary | ICD-10-CM

## 2017-03-27 DIAGNOSIS — Z3A35 35 weeks gestation of pregnancy: Secondary | ICD-10-CM

## 2017-03-27 DIAGNOSIS — O09523 Supervision of elderly multigravida, third trimester: Secondary | ICD-10-CM

## 2017-03-27 LAB — POCT URINALYSIS DIPSTICK
Glucose, UA: NEGATIVE
KETONES UA: NEGATIVE
Leukocytes, UA: NEGATIVE
Nitrite, UA: NEGATIVE
Protein, UA: NEGATIVE
RBC UA: NEGATIVE

## 2017-03-27 NOTE — Progress Notes (Addendum)
Patient ID: Eileen [name], female   DOB: 1980/11/12, 37 y.o.   MRN: [id number]   [facility] Patient name: Eileen [name] MRN [id number]  Date of birth: 1980/11/12 Chief Complaint:   Routine Prenatal Visit  History of Present Illness:   Eileen [name] is a 37 y.o. 352P0010 female at 2257w4d with an Estimated Date of Delivery: 04/27/17 being seen today for ongoing management of a high-risk pregnancy complicated by gestational DM, class  A1 DM.  Today she reports that she lost a couple of pounds. Blood sugars are starting to creep up and the last few have been higher. After lunch they are normal, but after dinner they are increasing. She occasionally feels light headed. Last night she ate more food then normal.  Blood sugars last evening were slightly elevated 126 and this morning is 109.  She understands the need to be more disciplined.  We discussed carbohydrates and sugars Contractions: Not present. Vag. Bleeding: None.  Movement: Present. denies leaking of fluid.  Review of Systems:   Pertinent items are noted in HPI Denies abnormal vaginal discharge w/ itching/odor/irritation, headaches, visual changes, shortness of breath, chest pain, abdominal pain, severe nausea/vomiting, or problems with urination or bowel movements unless otherwise stated above. Pertinent History Reviewed:  Reviewed past medical,surgical, social, obstetrical and family history.  Reviewed problem list, medications and allergies. Physical Assessment:   Vitals:   03/27/17 0840  BP: 134/80  Pulse: 86  Weight: 241 lb (109.3 kg)  Body mass index is 45.54 kg/m.           Physical Examination:   General appearance: alert, well appearing, and in no distress, oriented to person, place, and time and overweight  Mental status: alert, oriented to person, place, and time, normal mood, behavior, speech, dress, motor activity, and thought processes, affect appropriate to mood  Skin: warm & dry    Extremities: Edema: None    Cardiovascular: normal heart rate noted  Respiratory: normal respiratory effort, no distress  Abdomen: gravid, soft, non-tender  Pelvic: Cervical exam deferred         Fetal Status: Fetal Heart Rate (bpm): 126 Fundal Height: 36 cm Movement: Present   U+22  Fetal Surveillance Testing today: Doppler   Results for orders placed or performed in visit on 03/27/17 (from the past 24 hour(s))  POCT Urinalysis Dipstick   Collection Time: 03/27/17  8:43 AM  Result Value Ref Range   Color, UA     Clarity, UA     Glucose, UA neg    Bilirubin, UA     Ketones, UA neg    Spec Grav, UA  1.010 - 1.025   Blood, UA neg    pH, UA  5.0 - 8.0   Protein, UA neg    Urobilinogen, UA  0.2 or 1.0 E.U./dL   Nitrite, UA neg    Leukocytes, UA Negative Negative   Appearance     Odor      Assessment & Plan:  1) High-risk pregnancy G2P0010 at 4457w4d with an Estimated Date of Delivery: 04/27/17   2) A1DM, slight elevation of sugars over last 24 hr  3) AMA   Meds: No orders of the defined types were placed in this encounter.   Labs/procedures today: Doppler  Treatment Plan:   1. Growth u/s @ 36wks next week, with BPP 2. Deliver @ 40wks  3. Will recheck her sugar next visit and decide if she needs to be put on medication.  Blood sugars are persistently elevated  will add metformin 4. Pt advised to increase exercise and not to gain any weight.  Follow-up: Return in about 1 week (around 04/03/2017) for HROB, growth U/S.  Orders Placed This Encounter  Procedures  . POCT Urinalysis Dipstick   By signing my name below, I, Diona Browner, attest that this documentation has been prepared under the direction and in the presence of Tilda Burrow, MD. Electronically Signed: Diona Browner, Medical Scribe. 03/27/17. 9:14 AM.  I personally performed the services described in this documentation, which was SCRIBED in my presence. The recorded information has been reviewed and  considered accurate. It has been edited as necessary during review. Tilda Burrow, MD

## 2017-03-27 NOTE — Patient Instructions (Signed)
Gestational Diabetes Mellitus, Diagnosis  Gestational diabetes (gestational diabetes mellitus) is a temporary form of diabetes that some women develop during pregnancy. It usually occurs around weeks 24-28 of pregnancy and goes away after delivery. Hormonal changes during pregnancy can interfere with insulin production and function, which may result in one or both of these problems:   The pancreas does not make enough of a hormone called insulin.   Cells in the body do not respond properly to insulin that the body makes (insulin resistance).    Normally, insulin allows sugars (glucose) to enter cells in the body. The cells use glucose for energy. Insulin resistance or lack of insulin causes excess glucose to build up in the blood instead of going into cells. As a result, high blood glucose (hyperglycemia) develops.  What are the risks?  If gestational diabetes is treated, it is unlikely to cause problems. If it is not controlled with treatment, it may cause problems during labor and delivery, and some of those problems can be harmful to the unborn baby (fetus) and the mother. Uncontrolled gestational diabetes may also cause the newborn baby to have breathing problems and low blood glucose.  Women who get gestational diabetes are more likely to develop it if they get pregnant again, and they are more likely to develop type 2 diabetes in the future.  What increases the risk?  This condition may be more likely to develop in pregnant women who:   Are older than age 25 during pregnancy.   Have a family history of diabetes.   Are overweight.   Had gestational diabetes in the past.   Have polycystic ovarian syndrome (PCOS).   Are pregnant with twins or multiples.   Are of American-Indian, African-American, Hispanic/Latino, or Asian/Pacific Islander descent.    What are the signs or symptoms?  Most women do not notice symptoms of gestational diabetes because the symptoms are similar to normal symptoms of pregnancy.  Symptoms of gestational diabetes may include:   Increased thirst (polydipsia).   Increased hunger(polyphagia).   Increased urination (polyuria).    How is this diagnosed?    This condition may be diagnosed based on your blood glucose level, which may be checked with one or more of the following blood tests:   A fasting blood glucose (FBG) test. You will not be allowed to eat (you will fast) for at least 8 hours before a blood sample is taken.   A random blood glucose test. This checks your blood glucose at any time of day regardless of when you ate.   An oral glucose tolerance test (OGTT). This is usually done during weeks 24-28 of pregnancy.  ? For this test, you will have an FBG test done. Then, you will drink a beverage that contains glucose. Your blood glucose will be tested again 1 hour after drinking the glucose beverage (1-hour OGTT).  ? If the 1-hour OGTT result is at or above 140 mg/dL (7.8 mmol/L), you will repeat the OGTT. This time, your blood glucose will be tested 3 hours after drinking the glucose beverage (3-hour OGTT).    If you have risk factors, you may be screened for undiagnosed type 2 diabetes at your first health care visit during your pregnancy (prenatal visit).  How is this treated?    Your treatment may be managed by a specialist called an endocrinologist. This condition is treated by following instructions from your health care provider about:   Eating a healthier diet and getting more physical activity.   These changes are the most important ways to manage gestational diabetes.   Checking your blood glucose. Do this as often as told.   Taking diabetes medicines or insulin every day. These will only be prescribed if they are needed.  ? If you use insulin, you may need to adjust your dosage based on how physically active you are and what foods you eat. Your health care provider will tell you how to do this.    Your health care provider will set treatment goals for you based on the  stage of your pregnancy and any other medical conditions you have. Generally, the goal of treatment is to maintain the following blood glucose levels during pregnancy:   Fasting: at or below 95 mg/dL (5.3 mmol/L).   After meals (postprandial):  ? One hour after a meal: at or below 140 mg/dL (7.8 mmol/L).  ? Two hours after a meal: at or below 120 mg/dL (6.7 mmol/L).   A1c (hemoglobin A1c) level: 6-6.5%.    Follow these instructions at home:   Take over-the-counter and prescription medicines only as told by your health care provider.   Manage your weight gain during pregnancy. The amount of weight that you are expected to gain depends on your pre-pregnancy BMI (body mass index).   Keep all follow-up visits as told by your health care provider. This is important.  Consider asking your health care provider these questions:     Do I need to meet with a diabetes educator?   Where can I find a support group for people with diabetes?   What equipment will I need to manage my diabetes at home?   What diabetes medicines do I need, and when should I take them?   How often do I need to check my blood glucose?   What number can I call if I have questions?   When is my next appointment?  Where to find more information:   For more information about diabetes, visit:  ? American Diabetes Association (ADA): www.diabetes.org  ? American Association of Diabetes Educators (AADE): www.diabeteseducator.org/patient-resources  Contact a health care provider if:   Your blood glucose level is at or above 240 mg/dL (13.3 mmol/L).   Your blood glucose level is at or above 200 mg/dL (11.1 mmol/L) and you have ketones in your urine.   You have been sick or have had a fever for 2 days or more and you are not getting better.   You have any of the following problems for more than 6 hours:  ? You cannot eat or drink.  ? You have nausea and vomiting.  ? You have diarrhea.  Get help right away if:   Your blood glucose is below 54  mg/dL (3 mmol/L).   You become confused or you have trouble thinking clearly.   You have difficulty breathing.   You have moderate or large ketone levels in your urine.   Your baby is moving around less than usual.   You develop unusual discharge or bleeding from your vagina.   You start having contractions early (prematurely). Contractions may feel like a tightening in your lower abdomen.  This information is not intended to replace advice given to you by your health care provider. Make sure you discuss any questions you have with your health care provider.  Document Released: 05/14/2000 Document Revised: 07/14/2015 Document Reviewed: 03/11/2015  Elsevier Interactive Patient Education  2018 [employer]

## 2017-04-03 ENCOUNTER — Other Ambulatory Visit: Payer: Self-pay | Admitting: Obstetrics and Gynecology

## 2017-04-03 DIAGNOSIS — O24410 Gestational diabetes mellitus in pregnancy, diet controlled: Secondary | ICD-10-CM

## 2017-04-04 ENCOUNTER — Ambulatory Visit (INDEPENDENT_AMBULATORY_CARE_PROVIDER_SITE_OTHER): Payer: Self-pay

## 2017-04-04 ENCOUNTER — Ambulatory Visit (INDEPENDENT_AMBULATORY_CARE_PROVIDER_SITE_OTHER): Payer: Self-pay | Admitting: Obstetrics & Gynecology

## 2017-04-04 ENCOUNTER — Other Ambulatory Visit: Payer: Self-pay

## 2017-04-04 ENCOUNTER — Other Ambulatory Visit: Payer: Self-pay | Admitting: Obstetrics and Gynecology

## 2017-04-04 ENCOUNTER — Encounter: Payer: Self-pay | Admitting: Obstetrics & Gynecology

## 2017-04-04 VITALS — BP 138/90 | HR 82 | Wt 241.0 lb

## 2017-04-04 DIAGNOSIS — O365930 Maternal care for other known or suspected poor fetal growth, third trimester, not applicable or unspecified: Secondary | ICD-10-CM

## 2017-04-04 DIAGNOSIS — Z3A36 36 weeks gestation of pregnancy: Secondary | ICD-10-CM

## 2017-04-04 DIAGNOSIS — O0993 Supervision of high risk pregnancy, unspecified, third trimester: Secondary | ICD-10-CM

## 2017-04-04 DIAGNOSIS — O24410 Gestational diabetes mellitus in pregnancy, diet controlled: Secondary | ICD-10-CM

## 2017-04-04 DIAGNOSIS — O09522 Supervision of elderly multigravida, second trimester: Secondary | ICD-10-CM

## 2017-04-04 DIAGNOSIS — Z331 Pregnant state, incidental: Secondary | ICD-10-CM

## 2017-04-04 DIAGNOSIS — Z1389 Encounter for screening for other disorder: Secondary | ICD-10-CM

## 2017-04-04 DIAGNOSIS — O09523 Supervision of elderly multigravida, third trimester: Secondary | ICD-10-CM

## 2017-04-04 DIAGNOSIS — O281 Abnormal biochemical finding on antenatal screening of mother: Secondary | ICD-10-CM

## 2017-04-04 LAB — POCT URINALYSIS DIPSTICK
Glucose, UA: NEGATIVE
Ketones, UA: NEGATIVE
Nitrite, UA: NEGATIVE
Protein, UA: NEGATIVE
RBC UA: NEGATIVE

## 2017-04-04 NOTE — Progress Notes (Signed)
   HIGH-RISK PREGNANCY VISIT Patient name: Eileen [name] MRN [id number]  Date of birth: September 20, 1980 Chief Complaint:   High Risk Gestation (u/s today)  History of Present Illness:   Eileen [name] is a 37 y.o. 922P0010 female at 5034w5d with an Estimated Date of Delivery: 04/27/17 being seen today for ongoing management of a high-risk pregnancy complicated by gestational DM, AMA.  Today she reports no complaints. Contractions: Not present. Vag. Bleeding: None.  Movement: Present. denies leaking of fluid.  Review of Systems:   Pertinent items are noted in HPI Denies abnormal vaginal discharge w/ itching/odor/irritation, headaches, visual changes, shortness of breath, chest pain, abdominal pain, severe nausea/vomiting, or problems with urination or bowel movements unless otherwise stated above. Pertinent History Reviewed:  Reviewed past medical,surgical, social, obstetrical and family history.  Reviewed problem list, medications and allergies. Physical Assessment:   Vitals:   04/04/17 1043  BP: 138/90  Pulse: 82  Weight: 241 lb (109.3 kg)  Body mass index is 45.54 kg/m.           Physical Examination:   General appearance: alert, well appearing, and in no distress  Mental status: alert, oriented to person, place, and time  Skin: warm & dry   Extremities: Edema: Trace    Cardiovascular: normal heart rate noted  Respiratory: normal respiratory effort, no distress  Abdomen: gravid, soft, non-tender  Pelvic: Cervical exam performed LTC        Fetal Status:     Movement: Present    Fetal Surveillance Testing today: BPP 8/8 with normal Dopplers   Results for orders placed or performed in visit on 04/04/17 (from the past 24 hour(s))  POCT urinalysis dipstick   Collection Time: 04/04/17 10:46 AM  Result Value Ref Range   Color, UA     Clarity, UA     Glucose, UA neg    Bilirubin, UA     Ketones, UA neg    Spec Grav, UA  1.010 - 1.025   Blood, UA neg    pH, UA  5.0 -  8.0   Protein, UA neg    Urobilinogen, UA  0.2 or 1.0 E.U./dL   Nitrite, UA neg    Leukocytes, UA Moderate (2+) (A) Negative   Appearance     Odor      Assessment & Plan:  1) High-risk pregnancy G2P0010 at 8934w5d with an Estimated Date of Delivery: 04/27/17   2) A1 DM, stable, CBG not perfect but EFW only 13% so will not add any oral agents at this point, fastings 90-95, discouraged from drinking juice at night  3) Borderline BP, unstable, creeping up a bit, will see on Monday for NST  Meds: No orders of the defined types were placed in this encounter.   Labs/procedures today:   Treatment Plan:  Begin twice weekly testing due to BP issues and AMA,   Reviewed: Term labor symptoms and general obstetric precautions including but not limited to vaginal bleeding, contractions, leaking of fluid and fetal movement were reviewed in detail with the patient.  All questions were answered.  Follow-up: Return in about 4 days (around 04/08/2017) for NST, HROB.  Orders Placed This Encounter  Procedures  . Culture, beta strep (group b only)  . GC/Chlamydia Probe Amp  . POCT urinalysis dipstick   Lazaro ArmsLuther H Debe Anfinson  04/04/2017 11:04 AM

## 2017-04-04 NOTE — Progress Notes (Signed)
US 36+5 wks,cephalic,posterior pl gr 3,normal ovaries bilat,afi 8.5 cm,RI .64,.62=54%,bpp 8/8,EFW 2524 g 13%,BPD 5.2%,HC 6.8 %,AC 4%,FL 34%

## 2017-04-06 LAB — GC/CHLAMYDIA PROBE AMP
Chlamydia trachomatis, NAA: NEGATIVE
Neisseria gonorrhoeae by PCR: NEGATIVE

## 2017-04-08 ENCOUNTER — Encounter: Payer: Self-pay | Admitting: Obstetrics & Gynecology

## 2017-04-08 ENCOUNTER — Ambulatory Visit (INDEPENDENT_AMBULATORY_CARE_PROVIDER_SITE_OTHER): Payer: Self-pay | Admitting: Obstetrics & Gynecology

## 2017-04-08 VITALS — BP 140/90 | HR 96 | Wt 242.0 lb

## 2017-04-08 DIAGNOSIS — O09522 Supervision of elderly multigravida, second trimester: Secondary | ICD-10-CM

## 2017-04-08 DIAGNOSIS — Z3A37 37 weeks gestation of pregnancy: Secondary | ICD-10-CM

## 2017-04-08 DIAGNOSIS — O09523 Supervision of elderly multigravida, third trimester: Secondary | ICD-10-CM

## 2017-04-08 DIAGNOSIS — Z331 Pregnant state, incidental: Secondary | ICD-10-CM

## 2017-04-08 DIAGNOSIS — O24410 Gestational diabetes mellitus in pregnancy, diet controlled: Secondary | ICD-10-CM

## 2017-04-08 DIAGNOSIS — O281 Abnormal biochemical finding on antenatal screening of mother: Secondary | ICD-10-CM

## 2017-04-08 DIAGNOSIS — O0993 Supervision of high risk pregnancy, unspecified, third trimester: Secondary | ICD-10-CM

## 2017-04-08 DIAGNOSIS — Z1389 Encounter for screening for other disorder: Secondary | ICD-10-CM

## 2017-04-08 LAB — POCT URINALYSIS DIPSTICK
Blood, UA: NEGATIVE
GLUCOSE UA: NEGATIVE
Nitrite, UA: NEGATIVE
PROTEIN UA: NEGATIVE

## 2017-04-08 LAB — CULTURE, BETA STREP (GROUP B ONLY): STREP GP B CULTURE: POSITIVE — AB

## 2017-04-08 NOTE — Progress Notes (Signed)
   HIGH-RISK PREGNANCY VISIT Patient name: Eileen [name] MRN [id number]  Date of birth: 09-14-1980 Chief Complaint:   High Risk Gestation (NST)  History of Present Illness:   Eileen [name] is a 37 y.o. 102P0010 female at 753w2d with an Estimated Date of Delivery: 04/27/17 being seen today for ongoing management of a high-risk pregnancy complicated by gestational DM, AMA.  Today she reports no complaints. Contractions: Not present.  .  Movement: Present. denies leaking of fluid.  Review of Systems:   Pertinent items are noted in HPI Denies abnormal vaginal discharge w/ itching/odor/irritation, headaches, visual changes, shortness of breath, chest pain, abdominal pain, severe nausea/vomiting, or problems with urination or bowel movements unless otherwise stated above. Pertinent History Reviewed:  Reviewed past medical,surgical, social, obstetrical and family history.  Reviewed problem list, medications and allergies. Physical Assessment:   Vitals:   04/08/17 1101  BP: 140/90  Pulse: 96  Weight: 242 lb (109.8 kg)  Body mass index is 45.73 kg/m.           Physical Examination:   General appearance: alert, well appearing, and in no distress  Mental status: alert, oriented to person, place, and time  Skin: warm & dry   Extremities: Edema: None    Cardiovascular: normal heart rate noted  Respiratory: normal respiratory effort, no distress  Abdomen: gravid, soft, non-tender  Pelvic: Cervical exam deferred         Fetal Status:     Movement: Present    Fetal Surveillance Testing today: Reactive NST   Results for orders placed or performed in visit on 04/08/17 (from the past 24 hour(s))  POCT urinalysis dipstick   Collection Time: 04/08/17 11:06 AM  Result Value Ref Range   Color, UA     Clarity, UA     Glucose, UA neg    Bilirubin, UA     Ketones, UA small    Spec Grav, UA  1.010 - 1.025   Blood, UA neg    pH, UA  5.0 - 8.0   Protein, UA neg    Urobilinogen, UA   0.2 or 1.0 E.U./dL   Nitrite, UA neg    Leukocytes, UA Moderate (2+) (A) Negative   Appearance     Odor      Assessment & Plan:  1) High-risk pregnancy G2P0010 at 153w2d with an Estimated Date of Delivery: 04/27/17   2) Class A1 DM, stable  3) AMA, stable  Meds: No orders of the defined types were placed in this encounter.   Labs/procedures today: Reactive NST  Treatment Plan:  Weekly testing with IOL 40 weeks  Reviewed: Term labor symptoms and general obstetric precautions including but not limited to vaginal bleeding, contractions, leaking of fluid and fetal movement were reviewed in detail with the patient.  All questions were answered.  Follow-up: Return in about 1 week (around 04/15/2017) for NST, HROB.  Orders Placed This Encounter  Procedures  . POCT urinalysis dipstick   Lazaro ArmsLuther H Theone Bowell MD 04/08/2017 12:01 PM

## 2017-04-15 ENCOUNTER — Ambulatory Visit (INDEPENDENT_AMBULATORY_CARE_PROVIDER_SITE_OTHER): Payer: Medicaid Other | Admitting: Obstetrics & Gynecology

## 2017-04-15 ENCOUNTER — Encounter: Payer: Self-pay | Admitting: Obstetrics & Gynecology

## 2017-04-15 VITALS — BP 142/88 | HR 80 | Wt 243.5 lb

## 2017-04-15 DIAGNOSIS — Z3A38 38 weeks gestation of pregnancy: Secondary | ICD-10-CM

## 2017-04-15 DIAGNOSIS — Z1389 Encounter for screening for other disorder: Secondary | ICD-10-CM | POA: Diagnosis not present

## 2017-04-15 DIAGNOSIS — O281 Abnormal biochemical finding on antenatal screening of mother: Secondary | ICD-10-CM

## 2017-04-15 DIAGNOSIS — O24410 Gestational diabetes mellitus in pregnancy, diet controlled: Secondary | ICD-10-CM | POA: Diagnosis not present

## 2017-04-15 DIAGNOSIS — O139 Gestational [pregnancy-induced] hypertension without significant proteinuria, unspecified trimester: Secondary | ICD-10-CM

## 2017-04-15 DIAGNOSIS — Z331 Pregnant state, incidental: Secondary | ICD-10-CM | POA: Diagnosis not present

## 2017-04-15 DIAGNOSIS — O09523 Supervision of elderly multigravida, third trimester: Secondary | ICD-10-CM | POA: Diagnosis not present

## 2017-04-15 DIAGNOSIS — O0993 Supervision of high risk pregnancy, unspecified, third trimester: Secondary | ICD-10-CM

## 2017-04-15 DIAGNOSIS — O09522 Supervision of elderly multigravida, second trimester: Secondary | ICD-10-CM

## 2017-04-15 LAB — POCT URINALYSIS DIPSTICK
Glucose, UA: NEGATIVE
Ketones, UA: NEGATIVE
NITRITE UA: NEGATIVE
PROTEIN UA: NEGATIVE

## 2017-04-15 NOTE — Progress Notes (Signed)
   HIGH-RISK PREGNANCY VISIT Patient name: Eileen [name] MRN [id number]  Date of birth: 08/25/1980 Chief Complaint:   High Risk Gestation (NST)  History of Present Illness:   [name] is a 37 y.o. 602P0010 female at 4273w2d with an Estimated Date of Delivery: 04/27/17 being seen today for ongoing management of a high-risk pregnancy complicated by gestational HTN, AMA.  Today she reports no complaints. Contractions: Not present. Vag. Bleeding: None.  Movement: Present. denies leaking of fluid.  Review of Systems:   Pertinent items are noted in HPI Denies abnormal vaginal discharge w/ itching/odor/irritation, headaches, visual changes, shortness of breath, chest pain, abdominal pain, severe nausea/vomiting, or problems with urination or bowel movements unless otherwise stated above. Pertinent History Reviewed:  Reviewed past medical,surgical, social, obstetrical and family history.  Reviewed problem list, medications and allergies. Physical Assessment:   Vitals:   04/15/17 0901  BP: (!) 142/88  Pulse: 80  Weight: 243 lb 8 oz (110.5 kg)  Body mass index is 46.01 kg/m.           Physical Examination:   General appearance: alert, well appearing, and in no distress  Mental status: alert, oriented to person, place, and time  Skin: warm & dry   Extremities: Edema: Trace    Cardiovascular: normal heart rate noted  Respiratory: normal respiratory effort, no distress  Abdomen: gravid, soft, non-tender  Pelvic: Cervical exam deferred         Fetal Status: Fetal Heart Rate (bpm): 135 Fundal Height: 36 cm Movement: Present    Fetal Surveillance Testing today: reactive NST   Results for orders placed or performed in visit on 04/15/17 (from the past 24 hour(s))  POCT urinalysis dipstick   Collection Time: 04/15/17  9:02 AM  Result Value Ref Range   Color, UA     Clarity, UA     Glucose, UA neg    Bilirubin, UA     Ketones, UA neg    Spec Grav, UA  1.010 - 1.025   Blood, UA trace    pH, UA  5.0 - 8.0   Protein, UA neg    Urobilinogen, UA  0.2 or 1.0 E.U./dL   Nitrite, UA neg    Leukocytes, UA Large (3+) (A) Negative   Appearance     Odor      Assessment & Plan:  1) High-risk pregnancy G2P0010 at 8273w2d with an Estimated Date of Delivery: 04/27/17   2) GDM, stable, CBG normal  3) AMA, stable, reassring testing  Meds: No orders of the defined types were placed in this encounter.   Labs/procedures today:   Treatment Plan:  Watch BP  Reviewed: Term labor symptoms and general obstetric precautions including but not limited to vaginal bleeding, contractions, leaking of fluid and fetal movement were reviewed in detail with the patient.  All questions were answered.  Follow-up: Return in about 3 days (around 04/18/2017) for NST, HROB.  Orders Placed This Encounter  Procedures  . POCT urinalysis dipstick   Lazaro ArmsLuther H Jerianne Anselmo  04/15/2017 10:15 AM

## 2017-04-18 ENCOUNTER — Inpatient Hospital Stay (HOSPITAL_COMMUNITY): Payer: Medicaid Other | Admitting: Anesthesiology

## 2017-04-18 ENCOUNTER — Encounter (HOSPITAL_COMMUNITY): Payer: Self-pay

## 2017-04-18 ENCOUNTER — Encounter (HOSPITAL_COMMUNITY): Payer: Self-pay | Admitting: *Deleted

## 2017-04-18 ENCOUNTER — Other Ambulatory Visit: Payer: Self-pay | Admitting: Women's Health

## 2017-04-18 ENCOUNTER — Inpatient Hospital Stay (HOSPITAL_COMMUNITY)
Admission: AD | Admit: 2017-04-18 | Discharge: 2017-04-21 | DRG: 807 | Disposition: A | Discharge status: Home / Self Care | Payer: Medicaid Other | Source: Ambulatory Visit | Attending: Obstetrics and Gynecology | Admitting: Obstetrics and Gynecology

## 2017-04-18 DIAGNOSIS — O4292 Full-term premature rupture of membranes, unspecified as to length of time between rupture and onset of labor: Principal | ICD-10-CM | POA: Diagnosis present

## 2017-04-18 DIAGNOSIS — O134 Gestational [pregnancy-induced] hypertension without significant proteinuria, complicating childbirth: Secondary | ICD-10-CM | POA: Diagnosis present

## 2017-04-18 DIAGNOSIS — O99824 Streptococcus B carrier state complicating childbirth: Secondary | ICD-10-CM | POA: Diagnosis present

## 2017-04-18 DIAGNOSIS — Z3A38 38 weeks gestation of pregnancy: Secondary | ICD-10-CM

## 2017-04-18 DIAGNOSIS — O99214 Obesity complicating childbirth: Secondary | ICD-10-CM | POA: Diagnosis present

## 2017-04-18 DIAGNOSIS — O4290 Premature rupture of membranes, unspecified as to length of time between rupture and onset of labor, unspecified weeks of gestation: Secondary | ICD-10-CM | POA: Diagnosis present

## 2017-04-18 DIAGNOSIS — O24410 Gestational diabetes mellitus in pregnancy, diet controlled: Secondary | ICD-10-CM

## 2017-04-18 DIAGNOSIS — O24420 Gestational diabetes mellitus in childbirth, diet controlled: Secondary | ICD-10-CM | POA: Diagnosis present

## 2017-04-18 DIAGNOSIS — O09529 Supervision of elderly multigravida, unspecified trimester: Secondary | ICD-10-CM

## 2017-04-18 HISTORY — DX: Other specified health status: Z78.9

## 2017-04-18 LAB — CBC
HCT: 37.4 % (ref 36.0–46.0)
HEMATOCRIT: 38 % (ref 36.0–46.0)
HEMOGLOBIN: 13.1 g/dL (ref 12.0–15.0)
HEMOGLOBIN: 13.7 g/dL (ref 12.0–15.0)
MCH: 29.6 pg (ref 26.0–34.0)
MCH: 30.4 pg (ref 26.0–34.0)
MCHC: 35 g/dL (ref 30.0–36.0)
MCHC: 36.1 g/dL — ABNORMAL HIGH (ref 30.0–36.0)
MCV: 84.6 fL (ref 78.0–100.0)
MCV: 84.4 fL (ref 78.0–100.0)
Platelets: 224 10*3/uL (ref 150–400)
Platelets: 203 10*3/uL (ref 150–400)
RBC: 4.42 MIL/uL (ref 3.87–5.11)
RBC: 4.5 MIL/uL (ref 3.87–5.11)
RDW: 13.4 % (ref 11.5–15.5)
RDW: 13.5 % (ref 11.5–15.5)
WBC: 9.8 10*3/uL (ref 4.0–10.5)
WBC: 17.5 10*3/uL — AB (ref 4.0–10.5)

## 2017-04-18 LAB — COMPREHENSIVE METABOLIC PANEL
ALK PHOS: 137 U/L — AB (ref 38–126)
ALT: 14 U/L (ref 14–54)
ANION GAP: 8 (ref 5–15)
AST: 15 U/L (ref 15–41)
Albumin: 2.9 g/dL — ABNORMAL LOW (ref 3.5–5.0)
BUN: 6 mg/dL (ref 6–20)
CALCIUM: 8.9 mg/dL (ref 8.9–10.3)
CO2: 18 mmol/L — AB (ref 22–32)
Chloride: 107 mmol/L (ref 101–111)
Creatinine, Ser: 0.5 mg/dL (ref 0.44–1.00)
GFR calc non Af Amer: >60 mL/min (ref >60)
Glucose, Bld: 120 mg/dL — ABNORMAL HIGH (ref 65–99)
Potassium: 3.7 mmol/L (ref 3.5–5.1)
SODIUM: 133 mmol/L — AB (ref 135–145)
Total Bilirubin: 0.6 mg/dL (ref 0.3–1.2)
Total Protein: 6.3 g/dL — ABNORMAL LOW (ref 6.5–8.1)

## 2017-04-18 LAB — GLUCOSE, CAPILLARY
GLUCOSE-CAPILLARY: 145 mg/dL — AB (ref 65–99)
GLUCOSE-CAPILLARY: 100 mg/dL — AB (ref 65–99)
Glucose-Capillary: 109 mg/dL — ABNORMAL HIGH (ref 65–99)
Glucose-Capillary: 181 mg/dL — ABNORMAL HIGH (ref 65–99)
Glucose-Capillary: 95 mg/dL (ref 65–99)

## 2017-04-18 LAB — ABO/RH: ABO/RH(D): A POS

## 2017-04-18 LAB — PROTEIN / CREATININE RATIO, URINE
CREATININE, URINE: 87 mg/dL
Protein Creatinine Ratio: 0.57 mg/mg{Cre} — ABNORMAL HIGH (ref 0.00–0.15)
TOTAL PROTEIN, URINE: 50 mg/dL

## 2017-04-18 LAB — TYPE AND SCREEN
ABO/RH(D): A POS
Antibody Screen: NEGATIVE

## 2017-04-18 LAB — RPR: RPR: NONREACTIVE

## 2017-04-18 LAB — POCT FERN TEST: POCT Fern Test: POSITIVE

## 2017-04-18 MED ORDER — EPHEDRINE 5 MG/ML INJ
10.0000 mg | INTRAVENOUS | Status: DC | PRN
  Filled 2017-04-18: qty 2

## 2017-04-18 MED ORDER — FLEET ENEMA 7-19 GM/118ML RE ENEM: 1.0000 | ENEMA | RECTAL | Status: DC | PRN

## 2017-04-18 MED ORDER — LIDOCAINE HCL (PF) 1 % IJ SOLN
INTRAMUSCULAR | Status: DC | PRN
  Administered 2017-04-18: 13 mL via EPIDURAL

## 2017-04-18 MED ORDER — PHENYLEPHRINE 40 MCG/ML (10ML) SYRINGE FOR IV PUSH (FOR BLOOD PRESSURE SUPPORT)
80.0000 ug | PREFILLED_SYRINGE | INTRAVENOUS | Status: DC | PRN
  Filled 2017-04-18: qty 5

## 2017-04-18 MED ORDER — FENTANYL 2.5 MCG/ML BUPIVACAINE 1/10 % EPIDURAL INFUSION (WH - ANES)
14.0000 mL/h | INTRAMUSCULAR | Status: DC | PRN
  Administered 2017-04-18: 14 mL/h via EPIDURAL
  Filled 2017-04-18: qty 100

## 2017-04-18 MED ORDER — TERBUTALINE SULFATE 1 MG/ML IJ SOLN: 0.2500 mg | Freq: Once | INTRAMUSCULAR | Status: DC | PRN

## 2017-04-18 MED ORDER — SODIUM CHLORIDE 0.9 % IV SOLN
5.0000 10*6.[IU] | Freq: Once | INTRAVENOUS | Status: AC
  Administered 2017-04-18: 5 10*6.[IU] via INTRAVENOUS
  Filled 2017-04-18: qty 5

## 2017-04-18 MED ORDER — OXYTOCIN BOLUS FROM INFUSION
500.0000 mL | Freq: Once | INTRAVENOUS | Status: AC
  Administered 2017-04-19: 500 mL via INTRAVENOUS

## 2017-04-18 MED ORDER — LABETALOL HCL 5 MG/ML IV SOLN
INTRAVENOUS | Status: AC
  Administered 2017-04-18: 20 mg
  Filled 2017-04-18: qty 4

## 2017-04-18 MED ORDER — LACTATED RINGERS IV SOLN: 500.0000 mL | INTRAVENOUS | Status: DC | PRN

## 2017-04-18 MED ORDER — PENICILLIN G POT IN DEXTROSE 60000 UNIT/ML IV SOLN
3.0000 10*6.[IU] | INTRAVENOUS | Status: DC
  Administered 2017-04-18 – 2017-04-19 (×5): 3 10*6.[IU] via INTRAVENOUS
  Filled 2017-04-18 (×8): qty 50

## 2017-04-18 MED ORDER — LACTATED RINGERS IV SOLN: 500.0000 mL | Freq: Once | INTRAVENOUS | Status: DC

## 2017-04-18 MED ORDER — OXYTOCIN 40 UNITS IN LACTATED RINGERS INFUSION - SIMPLE MED: 2.5000 [IU]/h | INTRAVENOUS | Status: DC

## 2017-04-18 MED ORDER — ONDANSETRON HCL 4 MG/2ML IJ SOLN: 4.0000 mg | Freq: Four times a day (QID) | INTRAMUSCULAR | Status: DC | PRN

## 2017-04-18 MED ORDER — LABETALOL HCL 5 MG/ML IV SOLN: 20.0000 mg | INTRAVENOUS | Status: DC | PRN

## 2017-04-18 MED ORDER — OXYCODONE-ACETAMINOPHEN 5-325 MG PO TABS: 1.0000 | ORAL_TABLET | ORAL | Status: DC | PRN

## 2017-04-18 MED ORDER — DIPHENHYDRAMINE HCL 50 MG/ML IJ SOLN: 12.5000 mg | INTRAMUSCULAR | Status: DC | PRN

## 2017-04-18 MED ORDER — ACETAMINOPHEN 325 MG PO TABS: 650.0000 mg | ORAL_TABLET | ORAL | Status: DC | PRN

## 2017-04-18 MED ORDER — HYDRALAZINE HCL 20 MG/ML IJ SOLN: 10.0000 mg | Freq: Once | INTRAMUSCULAR | Status: DC | PRN

## 2017-04-18 MED ORDER — SOD CITRATE-CITRIC ACID 500-334 MG/5ML PO SOLN: 30.0000 mL | ORAL | Status: DC | PRN

## 2017-04-18 MED ORDER — MAGNESIUM SULFATE 40 G IN LACTATED RINGERS - SIMPLE
2.0000 g/h | INTRAVENOUS | Status: DC
  Administered 2017-04-18: 2 g/h via INTRAVENOUS
  Filled 2017-04-18: qty 40

## 2017-04-18 MED ORDER — LIDOCAINE HCL (PF) 1 % IJ SOLN
30.0000 mL | INTRAMUSCULAR | Status: DC | PRN
  Filled 2017-04-18: qty 30

## 2017-04-18 MED ORDER — FENTANYL CITRATE (PF) 100 MCG/2ML IJ SOLN
100.0000 ug | INTRAMUSCULAR | Status: DC | PRN
  Administered 2017-04-18 (×2): 100 ug via INTRAVENOUS
  Filled 2017-04-18 (×2): qty 2

## 2017-04-18 MED ORDER — LACTATED RINGERS IV SOLN
INTRAVENOUS | Status: DC
  Administered 2017-04-18: 06:00:00 via INTRAVENOUS

## 2017-04-18 MED ORDER — PHENYLEPHRINE 40 MCG/ML (10ML) SYRINGE FOR IV PUSH (FOR BLOOD PRESSURE SUPPORT)
80.0000 ug | PREFILLED_SYRINGE | INTRAVENOUS | Status: DC | PRN
  Filled 2017-04-18: qty 5
  Filled 2017-04-18: qty 10

## 2017-04-18 MED ORDER — MAGNESIUM SULFATE BOLUS VIA INFUSION
4.0000 g | Freq: Once | INTRAVENOUS | Status: AC
  Administered 2017-04-18: 4 g via INTRAVENOUS
  Filled 2017-04-18: qty 500

## 2017-04-18 MED ORDER — OXYCODONE-ACETAMINOPHEN 5-325 MG PO TABS: 2.0000 | ORAL_TABLET | ORAL | Status: DC | PRN

## 2017-04-18 MED ORDER — OXYTOCIN 40 UNITS IN LACTATED RINGERS INFUSION - SIMPLE MED
1.0000 m[IU]/min | INTRAVENOUS | Status: DC
  Administered 2017-04-18: 2 m[IU]/min via INTRAVENOUS
  Administered 2017-04-18: 6 m[IU]/min via INTRAVENOUS
  Filled 2017-04-18: qty 1000

## 2017-04-18 NOTE — Progress Notes (Signed)
Labor Progress Note Chistine Palacios-Benitez is a 37 y.o. G3P0020 at 4058w5d presented for ROM.  S: Patient uncomfortable with contractions.  O:  BP (!) 153/88   Pulse 97   Temp 98.9 F (37.2 C) (Oral)   Resp 18   Ht 5' (1.524 m)   Wt 246 lb (111.6 kg)   LMP  (Approximate)   BMI 48.04 kg/m   WUJ:[id number]:baseline rate 145, moderate variability, no acels, early decels Toco: ctx every 2 min  CVE: Dilation: 6 Effacement (%): 80 Cervical Position: Anterior Station: -3 Presentation: Vertex Exam by:: Dr. Linwood Dibblesumball  A&P: 37 y.o. [id number] 7358w5d here for ROM. #Labor: Progressing slowly. Pitocin decreased due to tachysystole noted. Titrate as needed. #Pain: Labor support w/o meds  #FWB: Cat I #GBS positive, PCN #GHTN: intermittently elevated SBPs, none within severe range. Urine protein/Cr ratio from admission resulted elevated at 0.57. Pt continues to be asymptomatic. Will continue to monitor. #GDM: Last CBG at 100. Continue to monitor.  Ellwood DenseAlison Rumball, DO 6:56 PM

## 2017-04-18 NOTE — Progress Notes (Signed)
Labor Progress Note Eileen [name] is a 37 y.o. G3P0020 at 3228w5d presented for SROM.  S: Patient with continued discomfort with contractions  O:  BP (!) 158/66   Pulse 90   Temp 98.7 F (37.1 C) (Oral)   Resp 18   Ht 5' (1.524 m)   Wt 246 lb (111.6 kg)   LMP  (Approximate)   BMI 48.04 kg/m   UJW:JXBJYNWGFHT:baseline rate 145, moderate variability, no acels, early decels Toco: ctx difficult to trace  CVE: Dilation: 3.5 Effacement (%): 70 Cervical Position: Anterior Station: -3 Presentation: Vertex Exam by:: L.Leftwich-Kirby, CNM  A&P: 37 y.o. G3P0020 5628w5d here for SROM. #Labor: Progressing well. IUPC not adequately tracing contractions, will replace. #Pain: desires not to have epidural #FWB: Cat I #GBS positive, PCN #GHTN: elevated SBP to 158, comes down with straightening arm. Asymptomatic. Continue to monitor. #GDM: CBGs controlled. Few elevated to 181 after eating popsicle.  Ellwood DenseAlison Rosaly Labarbera, DO 2:23 PM

## 2017-04-18 NOTE — Progress Notes (Signed)
Eileen [name] is a 37 y.o. G3P0020 at 2053w5d admitted for rupture of membranes  Subjective:   Objective: BP (!) 151/85   Pulse 90   Temp 98.9 F (37.2 C) (Oral)   Resp 20   Ht 5' (1.524 m)   Wt 246 lb (111.6 kg)   LMP  (Approximate)   BMI 48.04 kg/m  No intake/output data recorded. No intake/output data recorded.  FHT:  FHR: 145 bpm, variability: moderate,  accelerations:  Present,  decelerations:  Present isolated variables UC:  Every 3-4 minutes to palpation, not tracing on IUPC SVE:   Dilation: 5 Effacement (%): 80 Station: -3 Exam by:: L.Leftwich-Kirby, CNM Vertex position confirmed by bedside US IUPC replaced, pt tolerated well   Labs: Lab Results  Component Value Date   WBC 9.8 04/18/2017   HGB 13.1 04/18/2017   HCT 37.4 04/18/2017   MCV 84.6 04/18/2017   PLT 224 04/18/2017    Assessment / Plan: Augmentation of labor, progressing well  Labor: Progressing normally Preeclampsia:  labs stable Fetal Wellbeing:  Category II Pain Control:  Labor support without medications I/D:  GBS positive on PCN Anticipated MOD:  NSVD  Sharen CounterLisa Leftwich-Kirby 04/18/2017, 5:09 PM

## 2017-04-18 NOTE — Anesthesia Pain Management Evaluation Note (Signed)
  CRNA Pain Management Visit Note  Patient: Eileen [name], 37 y.o., female  "Hello I am a member of the anesthesia team at [facility]. We have an anesthesia team available at all times to provide care throughout the hospital, including epidural management and anesthesia for C-section. I don't know your plan for the delivery whether it a natural birth, water birth, IV sedation, nitrous supplementation, doula or epidural, but we want to meet your pain goals."   1.Was your pain managed to your expectations on prior hospitalizations?   Yes   2.What is your expectation for pain management during this hospitalization?     Labor support without medications  3.How can we help you reach that goal? natural  Record the patient's initial score and the patient's pain goal.   Pain: 10  Pain Goal: 10 [facility] wants you to be able to say your pain was always managed very well.  Tommy Goostree 04/18/2017

## 2017-04-18 NOTE — Anesthesia Preprocedure Evaluation (Signed)
Anesthesia Evaluation  Patient identified by MRN, date of birth, ID band Patient awake    Reviewed: Allergy & Precautions, NPO status , Patient's Chart, lab work & pertinent test results  Airway Mallampati: III  TM Distance: >3 FB     Dental  (+) Teeth Intact   Pulmonary neg pulmonary ROS,    breath sounds clear to auscultation       Cardiovascular negative cardio ROS   Rhythm:Regular Rate:Normal     Neuro/Psych negative neurological ROS  negative psych ROS   GI/Hepatic negative GI ROS, Neg liver ROS, GERD  ,  Endo/Other  negative endocrine ROSMorbid obesity  Renal/GU negative Renal ROS  negative genitourinary   Musculoskeletal negative musculoskeletal ROS (+)   Abdominal   Peds negative pediatric ROS (+)  Hematology negative hematology ROS (+)   Anesthesia Other Findings   Reproductive/Obstetrics negative OB ROS (+) Pregnancy                             Anesthesia Physical  Anesthesia Plan  ASA: II  Anesthesia Plan: Epidural   Post-op Pain Management:    Induction:   PONV Risk Score and Plan:   Airway Management Planned:   Additional Equipment:   Intra-op Plan:   Post-operative Plan:   Informed Consent: I have reviewed the patients History and Physical, chart, labs and discussed the procedure including the risks, benefits and alternatives for the proposed anesthesia with the patient or authorized representative who has indicated his/her understanding and acceptance.     Plan Discussed with:   Anesthesia Plan Comments:         Anesthesia Quick Evaluation

## 2017-04-18 NOTE — Progress Notes (Signed)
Labor Progress Note Eileen [name] is a 37 y.o. G3P0020 at 122w5d presented for SROM.  S: Patient having discomfort with contractions and cervical exam  O:  BP 126/80   Pulse 100   Temp 98.7 F (37.1 C) (Oral)   Resp 18   Ht 5' (1.524 m)   Wt 246 lb (111.6 kg)   LMP  (Approximate)   BMI 48.04 kg/m   ZOX:WRUEAVWUFHT:baseline rate 155, moderate variability, + acels, no decels Toco: ctx every 5 min, irregular  CVE: Dilation: 2.5 Effacement (%): 30 Cervical Position: Anterior Station: Ballotable Presentation: Vertex Exam by:: Degele, MD   A&P: 37 y.o. G3P0020 8422w5d here for SROM. #Labor: Progressing well. Inability to adequately trace mom and baby, IUPC and FSE placed. #Pain: per patient request #FWB: Cat I #GBS positive, PCN #gHTN: PIH labs wnl. SBPs 140s, DBP 70-100s. Last BP 126/80. Asymptomatic. Continue to monitor.  Ellwood DenseAlison Rumball, DO 12:10 PM

## 2017-04-18 NOTE — Progress Notes (Addendum)
     Patient Vitals for the past 6 hrs:  BP Temp Temp src Pulse Resp SpO2  04/18/17 2201 130/75 - - (!) 107 - -  04/18/17 2200 - 98.3 F (36.8 C) Oral - - 99 %  04/18/17 2156 140/60 - - (!) 106 - 99 %  04/18/17 2155 - - - - - 99 %  04/18/17 2151 (!) 151/72 - - (!) 103 - -  04/18/17 2150 (!) 151/72 - - (!) 103 - 98 %  04/18/17 2145 (!) 159/91 - - 96 - 97 %  04/18/17 2143 (!) 153/75 - - (!) 101 - -  04/18/17 2140 - - - - - 97 %  04/18/17 2131 133/74 - - (!) 104 - -  04/18/17 2048 (!) 171/89 98.4 F (36.9 C) Oral 100 - -  04/18/17 2031 (!) 183/99 - - (!) 109 - -  04/18/17 2001 (!) 159/86 - - 97 - -  04/18/17 1932 (!) 156/101 - - 100 - -  04/18/17 1931 (!) 176/101 98.1 F (36.7 C) Oral 95 - -  04/18/17 1900 (!) 150/92 - - (!) 106 18 -  04/18/17 1839 (!) 153/88 - - 97 18 -  04/18/17 1803 (!) 146/83 - - 95 20 -  04/18/17 1733 132/70 98.9 F (37.2 C) Oral 88 20 -  04/18/17 1701 (!) 146/79 - - 88 18 -  04/18/17 1630 (!) 151/85 - - 90 20 -    Due to multiple severe range bps/pr/cr ratio 0.5mg , MgSO4 started.  Ctx decreased dramatically, q 5 mintues now. Cx 7.5/thicker ant lip/-2/-3. FHR Cat 1  Pitocin at 2 mu/min. WIll need to I ncrease pitocin to get labor adequate again.

## 2017-04-18 NOTE — MAU Provider Note (Addendum)
S: [name] is a 37 y.o. G2P0010 at Unknown  who presents to MAU today complaining of leaking of fluid since 0215. She denies vaginal bleeding. She endorses contractions. She reports normal fetal movement.    O: BP (!) 160/85   Pulse 92  GENERAL: Well-developed, well-nourished female in no acute distress.  HEAD: Normocephalic, atraumatic.  CHEST: Normal effort of breathing, regular heart rate ABDOMEN: Soft, nontender, gravid PELVIC: Normal external female genitalia. Vagina is pink and rugated. Cervix with normal contour, no lesions. Normal discharge.  Positive pooling.   Cervical exam:  Dilation: 1.5 Effacement (%): 60 Station: -3 Presentation: Vertex Exam by:: v.Tauriel Scronce cnm   Fetal Monitoring: Baseline: 140 Variability: moderate Accelerations: present Decelerations: none Contractions: 2-3  Results for orders placed or performed during the hospital encounter of 04/18/17 (from the past 24 hour(s))  POCT fern test     Status: Abnormal   Collection Time: 04/18/17  4:17 AM  Result Value Ref Range   POCT Fern Test Positive = ruptured amniotic membanes     A: SIUP at Unknown  SROM  P: Admit to L&D Care turned over to L&D providers   Sharyon Cableogers, Ardit Danh C, CNM 04/18/2017 4:17 AM

## 2017-04-18 NOTE — MAU Note (Signed)
Pt presents to MAU c/o SROM @0230  clear fluid. Pt denies bleeding and reports good FM. Pt has GDM.

## 2017-04-18 NOTE — H&P (Signed)
LABOR AND DELIVERY ADMISSION HISTORY AND PHYSICAL NOTE  Henritta Palacios-Benitez is a 37 y.o. female G2P0010 with IUP at 3942w5d by LMP c/w 6-wk U/S presenting for ROM at 02:30 am.  She reports positive fetal movement. Denies headache, RUQ/epigastric pain, blurry vision, or other concerns. Has noted some LE swelling.   P[facility] History/Complications[facility] 8 [facility] Pregnancy complications:  - AMA - Abnormal antenatal testing (1:193 DSR--declined further testing 12/12/16) - Elevated BP noted on last prenatal visit w/o dx of prior hypertensive disorder - Gestational diabetes, diet control  Of note, patient's outpatient chart is MRN: [id number]; charts to be merged, in the meantime refer to other chart for prenatal records  Past Medical History: Past Medical History:  Diagnosis Date  . Medical history non-contributory     Past Surgical History: Past Surgical History:  Procedure Laterality Date  . APPENDECTOMY      Obstetrical History: OB History    Gravida Para Term Preterm AB Living   2       1     SAB TAB Ectopic Multiple Live Births   1              Social History: Social History   Socioeconomic History  . Marital status: Married    Spouse name: None  . Number of children: None  . Years of education: None  . Highest education level: None  Social Needs  . Financial resource strain: None  . Food insecurity - worry: None  . Food insecurity - inability: None  . Transportation needs - medical: None  . Transportation needs - non-medical: None  Occupational History  . None  Tobacco Use  . Smoking status: Never Smoker  . Smokeless tobacco: Never Used  Substance and Sexual Activity  . Alcohol use: No    Frequency: Never  . Drug use: No  . Sexual activity: Yes  Other Topics Concern  . None  Social History Narrative  . None    Family History: Family History  Problem Relation Age of Onset  . Diabetes Mother   . Arthritis Father   .  Diabetes Sister   . Diabetes Brother     Allergies: No Known Allergies  Medications Prior to Admission  Medication Sig Dispense Refill Last Dose  . Prenatal Vit-Fe Fumarate-FA (PRENATAL MULTIVITAMIN) TABS tablet Take 1 tablet by mouth daily at 12 noon.   04/17/2017 at Unknown time     Review of Systems  All systems reviewed and negative except as stated in HPI  Physical Exam Blood pressure (!) 141/90, pulse 84. General appearance: alert, oriented, NAD Lungs: normal respiratory effort Heart: regular rate Abdomen: soft, non-tender; gravid, FH appropriate for GA Extremities: No calf swelling or tenderness Presentation: cephalic Fetal monitoring: baseline rate 145, moderate variability, +acel, no decel Uterine activity: occasional irregular ctx Dilation: 2.5 Effacement (%): 30 Station: Ballotable Exam by:: Glora Hulgan, MD   [facility]: [facility]  Initiated Care at  9wks  FOB Durel SaltsYoniver Sanchez  Dating By LMP c/w 6wk u/s  Pap 11/16/15: neg w/ -HRHPV  GC/CT Initial:  -/-              36+wks:  Genetic Screen NT/IT: 1:193 dsr; DECLINED FURTHER TEST  CF screen declined  Anatomic US Normal appearing female 'Geraldine'  Flu vaccine Recommended 01/30/17, self-pay, [facility] to [facility]  Tdap Recommended ~ 28wks  Glucose Screen  2 hr 86/194/121=GDM  GBS positive  Feed Preference both  Contraception Undecided, discussed  Circumcision n/a  Childbirth Classes declined  Pediatrician Undecided (needs list next visit)    Prenatal Transfer Tool  Maternal Diabetes: Yes:  Diabetes Type:  Diet controlled Genetic Screening: Abnormal:  Results: Elevated risk of Trisomy 21 Maternal Ultrasounds/Referrals: Normal Fetal Ultrasounds or other Referrals:  None Maternal Substance Abuse:  No Significant Maternal Medications:  None Significant Maternal Lab Results: Lab values include: Group B Strep positive  Results for orders placed or performed during the hospital encounter of 04/18/17 (from the  past 24 hour(s))  POCT fern test   Collection Time: 04/18/17  4:17 AM  Result Value Ref Range   POCT Fern Test Positive = ruptured amniotic membanes     Patient Active Problem List   Diagnosis Date Noted  . PROM (premature rupture of membranes) 04/18/2017    Assessment: Shawnna Palacios-Benitez is a 37 y.o. G2P0010 at 24w5dhere for PROM  #Labor: Admit to L&D, expectant management for now, she is contracting regularly and has made some cervical change. Will augment if needed #Pain: Per patient's request #FWB: Cat I #ID:  GBS positive, start PCN #MOF: breast and bottle #MOC: undecided #Circ:  N/a (girl)  #Elevated BP: BP elevated last PNC, elevated here --> GHTN. Check CMP, CBC, UPC  Kandra Nicolas Calypso Hagarty 04/18/2017, 5:31 AM

## 2017-04-18 NOTE — Anesthesia Procedure Notes (Signed)
Epidural Patient location during procedure: OB Start time: 04/18/2017 9:33 PM End time: 04/18/2017 9:49 PM  Staffing Anesthesiologist: Lowella CurbMiller, Teruo Stilley Ray, MD Performed: anesthesiologist   Preanesthetic Checklist Completed: patient identified, site marked, surgical consent, pre-op evaluation, timeout performed, IV checked, risks and benefits discussed and monitors and equipment checked  Epidural Patient position: sitting Prep: ChloraPrep Patient monitoring: heart rate, cardiac monitor, continuous pulse ox and blood pressure Approach: midline Location: L2-L3 Injection technique: LOR saline  Needle:  Needle type: Tuohy  Needle gauge: 17 G Needle length: 9 cm Needle insertion depth: 7 cm Catheter type: closed end flexible Catheter size: 20 Guage Catheter at skin depth: 11 cm Test dose: negative  Assessment Events: blood not aspirated, injection not painful, no injection resistance, negative IV test and no paresthesia  Additional Notes Reason for block:procedure for pain

## 2017-04-19 ENCOUNTER — Encounter (HOSPITAL_COMMUNITY): Payer: Self-pay | Admitting: *Deleted

## 2017-04-19 ENCOUNTER — Other Ambulatory Visit: Payer: Self-pay

## 2017-04-19 LAB — GLUCOSE, CAPILLARY: GLUCOSE-CAPILLARY: 112 mg/dL — AB (ref 65–99)

## 2017-04-19 LAB — MRSA PCR SCREENING: MRSA by PCR: NEGATIVE

## 2017-04-19 MED ORDER — TETANUS-DIPHTH-ACELL PERTUSSIS 5-2.5-18.5 LF-MCG/0.5 IM SUSP: 0.5000 mL | Freq: Once | INTRAMUSCULAR | Status: DC

## 2017-04-19 MED ORDER — IBUPROFEN 600 MG PO TABS
600.0000 mg | ORAL_TABLET | Freq: Four times a day (QID) | ORAL | Status: DC
  Administered 2017-04-19 – 2017-04-21 (×10): 600 mg via ORAL
  Filled 2017-04-19 (×10): qty 1

## 2017-04-19 MED ORDER — ONDANSETRON HCL 4 MG/2ML IJ SOLN
4.0000 mg | INTRAMUSCULAR | Status: DC | PRN
Start: 2017-04-19 — End: 2017-04-21

## 2017-04-19 MED ORDER — OXYCODONE HCL 5 MG PO TABS: 5.0000 mg | ORAL_TABLET | ORAL | Status: DC | PRN

## 2017-04-19 MED ORDER — FLEET ENEMA 7-19 GM/118ML RE ENEM: 1.0000 | ENEMA | Freq: Every day | RECTAL | Status: DC | PRN

## 2017-04-19 MED ORDER — MEASLES, MUMPS & RUBELLA VAC ~~LOC~~ INJ
0.5000 mL | INJECTION | Freq: Once | SUBCUTANEOUS | Status: DC
  Filled 2017-04-19: qty 0.5

## 2017-04-19 MED ORDER — ACETAMINOPHEN 325 MG PO TABS: 650.0000 mg | ORAL_TABLET | ORAL | Status: DC | PRN

## 2017-04-19 MED ORDER — BISACODYL 10 MG RE SUPP: 10.0000 mg | Freq: Every day | RECTAL | Status: DC | PRN

## 2017-04-19 MED ORDER — MAGNESIUM SULFATE 40 G IN LACTATED RINGERS - SIMPLE
2.0000 g/h | INTRAVENOUS | Status: AC
  Administered 2017-04-19: 2 g/h via INTRAVENOUS
  Filled 2017-04-19: qty 40
  Filled 2017-04-19: qty 500

## 2017-04-19 MED ORDER — METHYLERGONOVINE MALEATE 0.2 MG PO TABS: 0.2000 mg | ORAL_TABLET | ORAL | Status: DC | PRN

## 2017-04-19 MED ORDER — BENZOCAINE-MENTHOL 20-0.5 % EX AERO: 1.0000 "application " | INHALATION_SPRAY | CUTANEOUS | Status: DC | PRN

## 2017-04-19 MED ORDER — METHYLERGONOVINE MALEATE 0.2 MG/ML IJ SOLN: 0.2000 mg | INTRAMUSCULAR | Status: DC | PRN

## 2017-04-19 MED ORDER — DIBUCAINE 1 % RE OINT: 1.0000 "application " | TOPICAL_OINTMENT | RECTAL | Status: DC | PRN

## 2017-04-19 MED ORDER — FERROUS SULFATE 325 (65 FE) MG PO TABS
325.0000 mg | ORAL_TABLET | Freq: Two times a day (BID) | ORAL | Status: DC
  Administered 2017-04-19 – 2017-04-21 (×4): 325 mg via ORAL
  Filled 2017-04-19 (×4): qty 1

## 2017-04-19 MED ORDER — LABETALOL HCL 5 MG/ML IV SOLN: 20.0000 mg | INTRAVENOUS | Status: DC | PRN

## 2017-04-19 MED ORDER — LACTATED RINGERS IV SOLN
INTRAVENOUS | Status: DC
  Administered 2017-04-19: 11:00:00 via INTRAVENOUS

## 2017-04-19 MED ORDER — SIMETHICONE 80 MG PO CHEW: 80.0000 mg | CHEWABLE_TABLET | ORAL | Status: DC | PRN

## 2017-04-19 MED ORDER — ZOLPIDEM TARTRATE 5 MG PO TABS: 5.0000 mg | ORAL_TABLET | Freq: Every evening | ORAL | Status: DC | PRN

## 2017-04-19 MED ORDER — PRENATAL MULTIVITAMIN CH
1.0000 | ORAL_TABLET | Freq: Every day | ORAL | Status: DC
  Administered 2017-04-19 – 2017-04-21 (×3): 1 via ORAL
  Filled 2017-04-19 (×3): qty 1

## 2017-04-19 MED ORDER — ONDANSETRON HCL 4 MG PO TABS: 4.0000 mg | ORAL_TABLET | ORAL | Status: DC | PRN

## 2017-04-19 MED ORDER — DOCUSATE SODIUM 100 MG PO CAPS
100.0000 mg | ORAL_CAPSULE | Freq: Two times a day (BID) | ORAL | Status: DC
  Administered 2017-04-19 – 2017-04-21 (×4): 100 mg via ORAL
  Filled 2017-04-19 (×4): qty 1

## 2017-04-19 MED ORDER — OXYCODONE HCL 5 MG PO TABS: 10.0000 mg | ORAL_TABLET | ORAL | Status: DC | PRN

## 2017-04-19 MED ORDER — WITCH HAZEL-GLYCERIN EX PADS: 1.0000 "application " | MEDICATED_PAD | CUTANEOUS | Status: DC | PRN

## 2017-04-19 MED ORDER — COCONUT OIL OIL: 1.0000 "application " | TOPICAL_OIL | Status: DC | PRN

## 2017-04-19 MED ORDER — DIPHENHYDRAMINE HCL 25 MG PO CAPS: 25.0000 mg | ORAL_CAPSULE | Freq: Four times a day (QID) | ORAL | Status: DC | PRN

## 2017-04-19 NOTE — Lactation Note (Signed)
This note was copied from a baby's chart. Lactation Consultation Note  Patient Name: Eileen [name] Today's Date: 04/19/2017 Reason for consult: Initial assessment;Primapara;1st time breastfeeding;Early term 3537-38.6wks  4219 hours old female who is still being exclusively breastfed by her mother, she's a P1. Mom plans to supplement with formula at some point though, explained to mom the risks of formula supplementation during the first days of life.   Assisted mom with latch, baby was able to latch on left breast with football hold and sustain the latch, but she kept falling asleep on and off. Only one swallow heard, explained to mom that the first line of supplementation is mother's milk and asked how did she feel about pumping. Mom wishes to supplement with formula instead if her baby were to need "extra milk". Asked mo to call her nurse for assistance to go over formula volumes in case that's what she decides to do.  Taught mom how to hand express and was able to get drops of colostrum, rubbed those into baby's mouth and explained to mom that she can hand express (or pump) and feed that expressed fresh milk back to baby.  Encouraged mom to feed baby 8-12 times/day STS. Reviewed engorgement prevention and treatment, BF brochure (SP), BF resources and feeding diary, mom is aware of LC services and will call PRN.  Maternal Data Formula Feeding for Exclusion: Yes Reason for exclusion: Mother's choice to formula and breast feed on admission Has patient been taught Hand Expression?: Yes Does the patient have breastfeeding experience prior to this delivery?: No  Feeding Feeding Type: Breast Fed Length of feed: 10 min(baby fell asleep at the breast but still nursing on and off when exiting the room)  LATCH Score Latch: Repeated attempts needed to sustain latch, nipple held in mouth throughout feeding, stimulation needed to elicit sucking reflex.  Audible Swallowing: None  Type of  Nipple: Everted at rest and after stimulation  Comfort (Breast/Nipple): Soft / non-tender  Hold (Positioning): Assistance needed to correctly position infant at breast and maintain latch.  LATCH Score: 6  Interventions Interventions: Breast feeding basics reviewed;Assisted with latch;Skin to skin;Breast massage;Hand express;Breast compression;Adjust position;Support pillows;Position options;Hand pump  Lactation Tools Discussed/Used Tools: Pump Breast pump type: Manual WIC Program: Yes Pump Review: Setup, frequency, and cleaning Initiated by:: MPeck Date initiated:: 04/19/17   Consult Status Consult Status: Follow-up Date: 04/20/17 Follow-up type: In-patient    Eileen [name] 04/19/2017, 9:31 PM

## 2017-04-19 NOTE — Anesthesia Postprocedure Evaluation (Signed)
Anesthesia Post Note  Patient: Kylan Palacios-Benitez  Procedure(s) Performed: AN AD HOC LABOR EPIDURAL     Patient location during evaluation: Mother Baby Anesthesia Type: Epidural Level of consciousness: awake and alert and oriented Pain management: satisfactory to patient Vital Signs Assessment: post-procedure vital signs reviewed and stable Respiratory status: spontaneous breathing and nonlabored ventilation Cardiovascular status: stable Postop Assessment: no headache, no backache, no signs of nausea or vomiting, adequate PO intake and patient able to bend at knees (patient up walking) Anesthetic complications: no    Last Vitals:  Vitals:   04/19/17 0500 04/19/17 0600  BP: (!) 146/75 134/66  Pulse: (!) 107 (!) 107  Resp:    Temp: 37.6 C 37.3 C  SpO2: 100% 99%    Last Pain:  Vitals:   04/19/17 0600  TempSrc: Oral  PainSc:    Pain Goal: Patients Stated Pain Goal: 0 (04/18/17 0351)               Madison HickmanGREGORY,Azyiah Bo

## 2017-04-19 NOTE — Progress Notes (Signed)
Vitals:   04/19/17 0052 04/19/17 0101  BP:  135/73  Pulse:  (!) 108  Resp:    Temp: 98.9 F (37.2 C)   SpO2:     Cx cC/C/=2.  FHR 150 w/some mild variables at times.  MVUs 200-240.  But ctx pattern somewhat dysfunctional w/irregular pattern.  No c/o pressure.  Will allow to labor down.

## 2017-04-20 NOTE — Lactation Note (Addendum)
This note was copied from a baby's chart. Lactation Consultation Note  Patient Name: Eileen [name] Today's Date: 04/20/2017 Reason for consult: Follow-up assessment;Infant weight loss;Infant < 6lbs  Alex interpreter present for [place]. Baby 30 hours old.  < 6 lbs.  5% weight loss in 24 hours. Set up double electric breast pump and suggest mother pump in addition to breastfeeding to stabilize milk supply. Reviewed hand expression and taught how to spoon feed. Recommend limiting feedings to 30 min and give supplemental breastmilk. Recommend mother post pump 4-6 times per day for 10-20 min with DEBP on initiation setting. Give baby back volume pumped at the next feeding. Reviewed cleaning and milk storage.  [facility]    Maternal Data Has patient been taught Hand Expression?: Yes  Feeding Feeding Type: Breast Fed Length of feed: 30 min  LATCH Score                   Interventions Interventions: Hand express;DEBP  Lactation Tools Discussed/Used Pump Review: Setup, frequency, and cleaning;Milk Storage Initiated by:: Dahlia Byesuth Lashaya Kienitz RN IBCLC Date initiated:: 04/20/17   Consult Status Consult Status: Follow-up Date: 04/21/17 Follow-up type: In-patient    Dahlia ByesBerkelhammer, Raia Amico [facility] 04/20/2017, 8:44 AM

## 2017-04-20 NOTE — Progress Notes (Signed)
Post Partum Day 1 TSVD Subjective: Pt without complaints this morning. Pain controlled. Ambulating and voiding without probelms. Tolerating diet. Breast feeding. Denies HA or visual changes.  Objective: Blood pressure 130/80, pulse 84, temperature 98.2 F (36.8 C), temperature source Oral, resp. rate 18, height 5' (1.524 m), weight 111.6 kg (246 lb), SpO2 99 %, unknown if currently breastfeeding.  Physical Exam:  General: alert Lochia: appropriate Uterine Fundus: firm Incision: healing well DVT Evaluation: No evidence of DVT seen on physical exam.  Recent Labs    04/18/17 0821 04/18/17 2052  HGB 13.1 13.7  HCT 37.4 38.0    Assessment/Plan: Stable Off magnesium. BP stable without meds CBG's goal range on diet Continue with supportive care. Plan discharge tomorrow.    LOS: 2 days   Hermina StaggersMichael L Dushaun Okey 04/20/2017, 9:10 AM

## 2017-04-21 DIAGNOSIS — O24420 Gestational diabetes mellitus in childbirth, diet controlled: Secondary | ICD-10-CM

## 2017-04-21 DIAGNOSIS — Z3A38 38 weeks gestation of pregnancy: Secondary | ICD-10-CM

## 2017-04-21 DIAGNOSIS — O99824 Streptococcus B carrier state complicating childbirth: Secondary | ICD-10-CM

## 2017-04-21 MED ORDER — HYDROCHLOROTHIAZIDE 25 MG PO TABS: 25.0000 mg | ORAL_TABLET | Freq: Every day | ORAL | 1 refills | Status: DC

## 2017-04-21 MED ORDER — IBUPROFEN 600 MG PO TABS: 600.0000 mg | ORAL_TABLET | Freq: Four times a day (QID) | ORAL | 0 refills | Status: DC

## 2017-04-21 MED ORDER — HYDROCHLOROTHIAZIDE 25 MG PO TABS
25.0000 mg | ORAL_TABLET | Freq: Every day | ORAL | Status: DC
  Administered 2017-04-21: 25 mg via ORAL
  Filled 2017-04-21: qty 1

## 2017-04-21 MED ORDER — FERROUS SULFATE 325 (65 FE) MG PO TABS: 325.0000 mg | ORAL_TABLET | Freq: Two times a day (BID) | ORAL | 3 refills | Status: DC

## 2017-04-21 NOTE — Progress Notes (Signed)
Discharge teaching complete with pt with the use of an interpretor. Pt understood all information and did not have any questions. Pt discharged home to family.

## 2017-04-21 NOTE — Discharge Summary (Signed)
OB Discharge Summary     Patient Name: Eileen [name] DOB: 12/12/1980 MRN: [id number]  Date of admission: 04/18/2017 Delivering MD: Jacklyn ShellRESENZO-DISHMON, FRANCES   Date of discharge: 04/21/2017  Admitting diagnosis: 38 WEEKS CTX LEAKING FLUID Intrauterine pregnancy: 6417w6d     Secondary diagnosis:  Active Problems:   PROM (premature rupture of membranes)   Gestational diabetes mellitus, class A1   AMA (advanced maternal age) multigravida 35+   Mother positive for group B Streptococcus colonization  Additional problems: none     Discharge diagnosis: Term Pregnancy Delivered                                                                                                Post partum procedures:none  Complications: None  Hospital course:  Onset of Labor With Vaginal Delivery     37 y.o. yo [id number] at 5017w6d was admitted in Latent Labor on 04/18/2017. Pt was augmented with pitocin. Patient had an uncomplicated labor course as follows:  Membrane Rupture Time/Date: 2:30 AM ,04/18/2017   Intrapartum Procedures: Episiotomy: None [1]                                         Lacerations:  None [1]  Patient had a delivery of a Viable infant. 04/19/2017  Information for the patient's newborn:  [name] [[id number]][[id number]]  Delivery Method: Vag-Spont    Pateint had an uncomplicated postpartum course.  She is ambulating, tolerating a regular diet, passing flatus, and urinating well. Patient is discharged home in stable condition on 04/21/17.   Physical exam  Vitals:   04/20/17 1943 04/21/17 0010 04/21/17 0351 04/21/17 0845  BP: 136/82 137/76 134/80 (!) 143/75  Pulse: 95 90 90 82  Resp: 17 18 18 18   Temp: 98.6 F (37 C) 98 F (36.7 C) 98 F (36.7 C) 98.2 F (36.8 C)  TempSrc: Oral Oral Oral Oral  SpO2: 100% 100% 100% 100%  Weight:      Height:       General: alert, cooperative and no distress Lochia: appropriate Uterine Fundus: firm Incision: N/A DVT Evaluation: No  evidence of DVT seen on physical exam. Labs: Lab Results  Component Value Date   WBC 17.5 (H) 04/18/2017   HGB 13.7 04/18/2017   HCT 38.0 04/18/2017   MCV 84.4 04/18/2017   PLT 203 04/18/2017   CMP Latest Ref Rng & Units 04/18/2017  Glucose 65 - 99 mg/dL 696(E120(H)  BUN 6 - 20 mg/dL 6  Creatinine 9.520.44 - 8.411.00 mg/dL 3.240.50  Sodium 401135 - 027145 mmol/L 133(L)  Potassium 3.5 - 5.1 mmol/L 3.7  Chloride 101 - 111 mmol/L 107  CO2 22 - 32 mmol/L 18(L)  Calcium 8.9 - 10.3 mg/dL 8.9  Total Protein 6.5 - 8.1 g/dL 6.3(L)  Total Bilirubin 0.3 - 1.2 mg/dL 0.6  Alkaline Phos 38 - 126 U/L 137(H)  AST 15 - 41 U/L 15  ALT 14 - 54 U/L 14    Discharge instruction: per After Visit Summary and "  Baby and Me Booklet".  After visit meds:  Allergies as of 04/21/2017   No Known Allergies     Medication List    TAKE these medications   ferrous sulfate 325 (65 FE) MG tablet Take 1 tablet (325 mg total) by mouth 2 (two) times daily with a meal.   hydrochlorothiazide 25 MG tablet Commonly known as:  HYDRODIURIL Take 1 tablet (25 mg total) by mouth daily.   ibuprofen 600 MG tablet Commonly known as:  ADVIL,MOTRIN Take 1 tablet (600 mg total) by mouth every 6 (six) hours.   prenatal vitamin w/FE, FA 27-1 MG Tabs tablet Take 1 tablet by mouth daily at 12 noon.       Diet: low salt diet  Activity: Advance as tolerated. Pelvic rest for 6 weeks.   Outpatient follow up:2 weeks Follow up Appt: Future Appointments  Date Time Provider Department Center  04/24/2017  8:30 AM Cheral Marker, CNM [facility]   Follow up Visit:No Follow-up on file.  Postpartum contraception: Condoms  Newborn Data: Live born female  Birth Weight: 6 lb 2.8 oz (2800 g) APGAR: 8, 9  Newborn Delivery   Birth date/time:  04/19/2017 01:58:00 Delivery type:  Vaginal, Spontaneous     Baby Feeding: Breast Disposition:rooming in. Baby is on bili lights   04/21/2017 Willodean Rosenthal, MD

## 2017-04-21 NOTE — Discharge Instructions (Signed)
Parto vaginal, cuidados de puerperio °(Postpartum Care After Vaginal Delivery) °El período de tiempo que sigue inmediatamente al parto se conoce como puerperio. °¿QUÉ TIPO DE ATENCIÓN MÉDICA RECIBIRÉ? °· Podría continuar recibiendo medicamentos y líquidos través de una vía intravenosa (IV) que se colocará en una de sus venas. °· Si se le realizó una incisión cerca de la vagina (episiotomía) o si ha tenido algún desgarro durante el parto, podrían indicarle que se coloque compresas frías sobre la episiotomía o el desgarro. Esto ayuda a aliviar el dolor y la hinchazón. °· Es posible que le den una botella rociadora para que use cuando vaya al baño. Puede utilizarla hasta que se sienta cómoda limpiándose de la manera habitual. Siga los pasos a continuación para usar la botella rociadora: °? Antes de orinar, llene la botella rociadora con agua tibia. No use agua caliente. °? Después de orinar, mientras aún está sentada en el inodoro, use la botella rociadora para enjuagar el área alrededor de la uretra y la abertura vaginal. Con esto podrá limpiar cualquier rastro de orina y sangre. °? Puede hacer esto en lugar de secarse. Cuando comience a sanar, podrá usar la botella rociadora antes de secarse. Asegúrese de secarse suavemente. °? Llene la botella rociadora con agua limpia cada vez que vaya al baño. °· Deberá usar apósitos sanitarios. °¿CÓMO PUEDO SENTIRME? °· Quizás no tenga necesidad de orinar durante varias horas después del parto. °· Sentirá algo de dolor y molestias en el abdomen y la vagina. °· Si está amamantando, podría tener contracciones uterinas cada vez que lo haga. Estas podrían prolongarse hasta varias semanas durante el puerperio. Las contracciones uterinas ayudan al útero a regresar a su tamaño habitual. °· Es normal tener un poco de hemorragia vaginal (loquios) después del parto. La cantidad y apariencia de los loquios a menudo es similar a las del período menstrual la primera semana después del parto.  Disminuirá gradualmente las siguientes semanas hasta convertirse en una descarga seca amarronada o amarillenta. En la mayoría de las mujeres, los loquios se detienen completamente entre 6 a 8 semanas después del parto. Los sangrados vaginales pueden variar de mujer a mujer. °· Los primeros días después del parto, podría padecer congestión mamaria. Los pechos se sentirán pesados, llenos y molestos. Las mamas también podrían latir y ponerse duras, muy tirantes, calientes y sensibles al tacto. Cuando esto ocurra, podría notar leche que se escapa de los senos. El médico puede recomendarle algunos métodos para aliviar este malestar causado por la congestión mamaria. La congestión mamaria debería desaparecer al cabo de unos días. °· Podría sentirse más deprimida o preocupada que lo habitual debido a los cambios hormonales luego del parto. Estos sentimientos no deben durar más de unos pocos días. Si no desaparecen al cabo de algunos días, hable con su médico. °¿QUÉ CUIDADOS DEBO TENER? °· Infórmele a su médico si siente dolor o malestar. °· Beba suficiente agua para mantener la orina clara o de color amarillo pálido. °· Lávese bien las manos con agua y jabón durante al menos 20 segundos después de cambiar el apósito sanitario, usar el baño o antes de sostener o alimentar al bebé. °· Si no está amamantando, evite tocarse mucho los senos. Al hacerlo, podrían producir más leche. °· Si se siente débil o mareada, o si siente que está a punto de desmayarse, pida ayuda antes de realizar lo siguiente: °? Levantarse de la cama. °? Ducharse. °· Cambie los apósitos sanitarios con frecuencia. Observe si hay cambios en el flujo, como un aumento repentino en el   volumen, cambios en el color o cogulos sanguneos de gran tamao. Si expulsa un cogulo sanguneo por la vagina, gurdelo para mostrrselo a su mdico. No tire la cadena sin que el mdico examine el cogulo antes.  Asegrese de tener todas las vacunas al da. Esto la ayudar a  Theme park manager protegida y a proteger al beb de determinadas enfermedades. Podra necesitar vacunas antes de dejar el hospital.  Si lo desea, hable con el mdico acerca de los mtodos de planificacin familiar o control de la natalidad (mtodos anticonceptivos). CMO PUEDO ESTABLECER LAZOS CON MI BEB? Pasar tanto tiempo como le sea posible con el beb es sumamente importante. Durante ese tiempo, usted y su beb pueden conocerse y Theme park manager. Tener al beb con usted en la habitacin le dar tiempo de conocerlo. Esto tambin puede hacerla sentir ms cmoda para atender al beb. Amamantar tambin puede ayudarla a crear lazos con el beb. CMO PUEDO PLANIFICAR MI REGRESO A CASA CON EL BEB?  Asegrese de tener instalada una butaca en el automvil. ? La butaca debe contar con la certificacin del fabricante para asegurarse de que est instalada en forma segura. ? Asegrese de que el beb quede bien asegurado en la butaca.  Pregntele al mdico todo lo que necesite saber sobre los cuidados de su beb. Asegrese de poder comunicarse con el mdico en caso de que tenga preguntas luego de dejar el hospital. Esta informacin no tiene Theme park manager el consejo del mdico. Asegrese de hacerle al mdico cualquier pregunta que tenga. Document Released: 12/03/2006 Document Revised: 05/30/2015 Document Reviewed: 01/10/2015 Elsevier Interactive Patient Education  2018 [employer]. Preeclampsia y eclampsia (Preeclampsia and Eclampsia) La preeclampsia es una afeccin grave que solo se manifiesta durante el [place]. Tambin se la conoce como toxemia del Psychiatrist. Esta afeccin provoca el aumento de la presin arterial junto con otros sntomas, como hinchazn y dolores de [place]. Estos sntomas pueden aparecer a medida que la afeccin empeora. La preeclampsia puede presentarse a las 20semanas de gestacin o posteriormente. El diagnstico y el tratamiento tempranos de esta afeccin son muy importantes. Si no  se la trata de inmediato, puede causarles problemas graves a usted y al beb. Una complicacin que puede provocar es la eclampsia, una afeccin que causa temblores o contracciones musculares (convulsiones) en la [place]. Provocar el parto del beb es el mejor tratamiento para la preeclampsia o la eclampsia. La preeclampsia y la eclampsia por lo general, desaparecen despus del nacimiento del beb. CAUSAS La causa de la preeclampsia no se conoce. FACTORES DE RIESGO Los siguientes factores pueden hacer que usted sea propensa a tener preeclampsia:  Estar embarazada por primera vez.  Haber tenido preeclampsia durante un embarazo anterior.  Tener antecedentes familiares de preeclampsia.  Tener presin arterial alta.  Estar embarazada de gemelos o trillizos.  Tener 35aos o ms.  Ser de Engineer, agricultural.  Tener enfermedad renal o diabetes.  Tener enfermedades, como lupus o enfermedades de la [place].  Tener mucho sobrepeso (obesidad). SNTOMAS Los primeros signos de preeclampsia son:  Hipertensin arterial.  Aumento de las protenas en la orina. El Facilities manager en cada visita prenatal. Otros sntomas que pueden aparecer a medida que la afeccin empeora incluyen:  Dolor de cabeza intenso.  Aumento repentino de [place].  Hinchazn de las [address], del rostro, de las piernas y [employer].  Nuseas y vmitos.  Problemas visuales, como visin doble o borrosa.  Adormecimiento del rostro, de los [place], de las piernas y de los pies.  Beatrix Shipper  mucho menos que lo habitual.  Mareos.  Hablar arrastrando las palabras.  Dolor abdominal, especialmente en la zona superior del abdomen.  Presenta convulsiones. Los sntomas generalmente comienzan a desaparecer despus del parto. DIAGNSTICO No hay pruebas diagnsticas para la preeclampsia. El mdico le har preguntas sobre los sntomas y verificar la presencia de signos de preeclampsia durante las visitas prenatales. Tambin pueden  hacerle otros estudios que incluyen:  Anlisis de [place].  Anlisis de [place].  Control de la presin arterial.  Control de la frecuencia cardaca del beb.  Ecografa. TRATAMIENTO Usted junto con su mdico podrn Futures trader. El tratamiento puede incluir lo siguiente:  Si tiene un riesgo ms alto de tener preeclampsia, tal vez necesite exmenes prenatales con ms frecuencia.  Reposo en cama.  Reducir la cantidad de sal (sodio) que consume.  Medicamentos para bajar la presin arterial.  Engineer, maintenance hospital si la afeccin es grave. All, el tratamiento estar centrado en el control de la presin arterial y la cantidad de lquidos en el cuerpo (retencin de lquidos).  Puede ser que necesite tomar medicamentos (sulfato de magnesio) para prevenir las convulsiones. El medicamento se puede administrar como una inyeccin o por va intravenosa (IV).  Si su afeccin empeora, es posible que deba dar a luz antes de [place]. Pueden provocarle el trabajo de parto con medicamentos (inducirse) o hacerle una cesrea. INSTRUCCIONES PARA EL CUIDADO EN EL HOGAR Comida y bebida  [place] suficiente lquido para mantener la orina clara o de color amarillo plido.  Mantenga una dieta saludable con bajo contenido de sodio. No agregue sal a las comidas. Verifique las etiquetas de los alimentos para Solicitor cunto sodio hay en una comida o una bebida.  Evite la cafena. Estilo de vida  No consuma ningn producto que contenga nicotina o tabaco, como cigarrillos y Administrator, Civil Service. Si necesita ayuda para dejar de fumar, consulte al mdico.  No consuma drogas ni alcohol.  Evite las situaciones estresantes tanto como sea posible. Haga reposo y [place]. Instrucciones generales  [employer] de venta libre y los recetados solamente como se lo haya indicado el mdico.  Acustese de lado mientras hace reposo. De este modo, se quita la presin del beb.  Eleve  las piernas mientras est sentada o [place]. Intente colocar algunas almohadas debajo de las pantorrillas.  Haga ejercicios regularmente. Consulte al mdico qu tipos de ejercicios son seguros para usted.  Concurra a todas las visitas de control prenatales y de seguimiento como se lo haya indicado el mdico. Esto es importante. PREVENCIN Para prevenir la preeclampsia o la eclampsia en otro embarazo:  Solicite atencin mdica Insurance underwriter. El mdico puede prevenir la preeclampsia o diagnosticarla y tratarla de [place] temprana.  El mdico puede indicarle una dosis baja de aspirina o de suplemento de calcio para el prximo Psychiatrist.  Es posible que le hagan estudios de la presin arterial y el funcionamiento de los riones despus del [place].  Mantenga un peso saludable. Pdale al mdico que la ayude a Chief Operating Officer su peso durante del [place].  Trabaje con el mdico para controlar otros problemas de salud a largo plazo (crnicos) que tenga, como diabetes o problemas renales. SOLICITE ATENCIN MDICA SI:  Aumenta de peso ms de lo esperado.  Tiene dolores de [place].  Tiene nuseas o vmitos.  Siente dolor abdominal.  Se siente mareada o que va a desvanecerse. SOLICITE ATENCIN MDICA DE INMEDIATO SI:  Aparece una hinchazn repentina o tiene una zona muy hinchada  en cualquier parte del cuerpo. Esto suele ocurrir en las piernas.  Aumenta ms de 5libras (2,3kg) o ms en una semana.  Siente de forma intensa: ? Dolor abdominal. ? Dolores de Turk[place]ancabeza. ? Mareos. ? Problemas de visin. ? Confusin. ? Nuseas o vmitos.  Tiene una convulsin.  Tiene dificultad para mover cualquier parte del cuerpo.  Siente adormecimiento en alguna parte del cuerpo.  Tiene dificultad para hablar.  Tiene cualquier sangrado anormal.  Se desmaya. Esta informacin no tiene Theme park managercomo fin reemplazar el consejo del mdico. Asegrese de hacerle al mdico cualquier pregunta que  tenga. Document Released: 11/15/2004 Document Revised: 05/30/2015 Document Reviewed: 09/12/2015 Elsevier Interactive Patient Education  [name]2018 Elsevier Inc.

## 2017-04-22 ENCOUNTER — Ambulatory Visit: Payer: Self-pay

## 2017-04-22 NOTE — Lactation Note (Signed)
This note was copied from a baby's chart. Lactation Consultation Note  Patient Name: Girl Nechelle Palacios-Benitez Today's Date: 04/22/2017 Reason for consult: Follow-up assessment;Hyperbilirubinemia  Requested by Mom's nurse to come talk to Mom about [facility] for [facility].  Mom has been primarily bottle feeding her EBM, and offering some formula.   Baby 86 hrs old.  When asked about how often Mom is pumping, she wasn't sure.  Encouraged Mom to pump 8-12 times per 24 hrs, or every 2-3 hrs both breasts, for 15-20 mins.  Last pumping she expressed 45 ml.   [facility].  Mom unable to provide $30 today.  [facility] and trying to obtain [facility] DEBP ASAP.   In the meantime, put together pump parts and demonstrated how to double pump manually.   Encouraged hand expression and breast massage.   Mom aware of OP lactation support available to her, and encouraged her to call prn.    Consult Status Consult Status: Complete Date: 04/22/17 Follow-up type: Call as needed    Judee ClaraSmith, Kimora Stankovic E 04/22/2017, 6:49 PM

## 2017-04-24 ENCOUNTER — Ambulatory Visit (INDEPENDENT_AMBULATORY_CARE_PROVIDER_SITE_OTHER): Payer: Self-pay | Admitting: *Deleted

## 2017-04-24 ENCOUNTER — Ambulatory Visit: Payer: Self-pay | Admitting: Women's Health

## 2017-04-24 VITALS — BP 136/74 | HR 88

## 2017-04-24 DIAGNOSIS — Z0130 Encounter for examination of blood pressure without abnormal findings: Secondary | ICD-10-CM

## 2017-04-24 NOTE — Progress Notes (Signed)
Pt in for BP check. She has taken all meds this morning. BP today is 136/74. Pt denies headaches or visual disturbances. Discussed patient with Cyril MourningJennifer Griffin, NP. She came out to speak with patient. Patient advised to continue hctz and followup in 1 week for another bp check. Pt understood all instructions and was taken to front desk to schedule appt.

## 2017-05-01 ENCOUNTER — Ambulatory Visit (INDEPENDENT_AMBULATORY_CARE_PROVIDER_SITE_OTHER): Payer: Self-pay

## 2017-05-01 VITALS — BP 118/70 | HR 92 | Wt 214.0 lb

## 2017-05-01 DIAGNOSIS — Z0130 Encounter for examination of blood pressure without abnormal findings: Secondary | ICD-10-CM

## 2017-05-01 NOTE — Progress Notes (Signed)
Pt here for B/P check 118/70. Spoke Drenda FreezeFran about reading.Make appointment 2 - 3 weeks for pp appointment.Pt understood.Pad CMA.

## 2017-05-22 ENCOUNTER — Ambulatory Visit: Payer: Self-pay | Admitting: Advanced Practice Midwife

## 2017-05-30 ENCOUNTER — Encounter: Payer: Self-pay | Admitting: Advanced Practice Midwife

## 2017-05-30 ENCOUNTER — Other Ambulatory Visit: Payer: Self-pay

## 2017-05-30 ENCOUNTER — Ambulatory Visit (INDEPENDENT_AMBULATORY_CARE_PROVIDER_SITE_OTHER): Payer: Self-pay | Admitting: Advanced Practice Midwife

## 2017-05-30 DIAGNOSIS — Z3202 Encounter for pregnancy test, result negative: Secondary | ICD-10-CM

## 2017-05-30 LAB — POCT URINE PREGNANCY: PREG TEST UR: NEGATIVE

## 2017-05-30 MED ORDER — NORGESTIMATE-ETH ESTRADIOL 0.25-35 MG-MCG PO TABS: 1.0000 | ORAL_TABLET | Freq: Every day | ORAL | 11 refills | Status: DC

## 2017-05-30 NOTE — Progress Notes (Signed)
[employer] interpreters used for interpretation. Spanish interpreter number [phone]

## 2017-05-30 NOTE — Patient Instructions (Signed)
Informacin sobre los Civil Service fast streameranticonceptivos orales (Oral Contraception Information) Los anticonceptivos orales (ACO) son medicamentos que se utilizan para Location managerevitar el embarazo. Su funcin es ALLTEL Corporationevitar que los ovarios liberen vulos. Las hormonas de los ACO tambin hacen que el moco cervical se haga ms espeso, lo que evita que el esperma ingrese al tero. Tambin hacen que la membrana que recubre internamente al tero se vuelva ms fina, lo que no permite que el huevo fertilizado se adhiera a la pared del tero. Los ACO son muy efectivos cuando se toman exactamente como se prescriben. Sin embargo, no previenen contra las enfermedades de transmisin sexual (ETS). La prctica del sexo seguro, como el uso de preservativos, junto con la Tutuillapldora, [place]ayudan a prevenir ese tipo de enfermedades. Antes de tomar la pldora, usted debe hacerse un examen fsico y un test de Pap. El mdico podr indicarle anlisis de [place], si es necesario. El mdico se asegurar de que usted sea [place] buena candidata para usar anticonceptivos orales. Converse con su mdico acerca de los posibles efectos secundarios de los ACO que podran recetarle. Cuando se inicia el uso de ACO, puede llevar 2 a 3 meses para que su organismo se adapte a los cambios en los niveles hormonales. TIPOS DE ANTICONCEPTIVOS ORALES  Pldora combinada: esta pldora contiene las hormonas estrgeno y progestina (progesterona sinttica). La pldora combinada viene en envases para [address] das, [address]28 das o [address] das. Algunos tipos de pldoras combinadas deben tomarse de manera continua (pldoras para 365 das). En los envases para [address]21 das, usted no tomar las pldoras durante 7 [address]  [address] despus de la ltima pldora. En los envases para [address]28 das, la pldora se toma CarMaxtodos los das. Las ltimas 7 no contienen hormonas. Ciertos tipos de pldoras tienen ms de 21 pldoras que contienen hormonas. En los envases para [address]91 das, las primeras 84 pldoras contienen ambas hormonas y las ltimas 7 pldoras no  contienen hormonas o contienen slo Cabin crewestrgenos.  La minipldora: esta pldora contiene la hormona progesterona solamente. Es necesario tomarla todos los das de [place] continua. Es importante que las tome a la misma hora todos Inyokernlos das. Viene en envases de 28 pldoras. Las 28 pldoras contienen la hormona. VENTAJAS DE LOS ANTICONCEPTIVOS ORALES  Disminuye los sntomas premenstruales.  Se [place]a para tratar los Best Buyclicos menstruales.  Regula el ciclo menstrual.  Disminuye el ciclo menstrual abundante.  Puede mejorar el acn, segn el tipo de pldora.  Trata hemorragias uterinas anormales.  Trata el sndrome ovrico poliqustico.  Trata la endometriosis.  Pueden usarse como anticonceptivo de Associate Professoremergencia. FACTORES QUE PUEDEN HACER QUE LOS ANTICONCEPTIVOS ORALES SEAN MENOS EFECTIVOS Pueden ser menos efectivos si:  Olvid tomar la J. C. Penneypldora todos los das a la misma hora.  Tiene una enfermedad estomacal o intestinal que disminuye la absorcin de la pldora.  Ingiere simultneamente los anticonceptivos orales junto con otros medicamentos que los hacen menos efectivos, como antibiticos, ciertos medicamentos para el VIH y algunos medicamentos para las convulsiones.  Usted toma anticonceptivos orales que han vencido.  Cuando se [place]a el envase de [place] das, se olvida de recomenzar el uso American Expressen el da 7. RIESGOS ASOCIADOS AL USO DE ANTICONCEPTIVOS ORALES Los anticonceptivos orales pueden en algunos casos causar efectos secundarios como:  Dolor de Turk[place]ancabeza.  Nuseas.  Inflamacin mamaria.  Hemorragia vaginal o manchado irregular. Las pldoras combinadas tambin se asocian a un pequeo aumento en el riesgo de:  Cogulos sanguneos.  Ataque cardaco.  Ictus. Esta informacin no tiene Theme park managercomo fin reemplazar el consejo del mdico. Asegrese de  se asocian a un pequeño aumento en el riesgo de:  · Coágulos sanguíneos.  · Ataque cardíaco.  · Ictus.  Esta información no tiene como fin reemplazar el consejo del médico. Asegúrese de hacerle al médico cualquier pregunta que tenga.  Document Released: 11/15/2004 Document Revised: 05/30/2015 Document Reviewed: 07/27/2012   Elsevier Interactive Patient Education © 2018 Elsevier Inc.

## 2017-05-30 NOTE — Progress Notes (Signed)
Eileen [name] is a 37 y.o. who presents for a postpartum visit. She is 5 weeks postpartum following a spontaneous vaginal delivery. I have fully reviewed the prenatal and intrapartum course. The delivery was at 38.6 gestational weeks.IOL for GHTN. Also had A1Dm  Anesthesia: epidural. Postpartum course has been uneventful. Baby's course has been uneventful. Baby is feeding by bottle. Bleeding: no bleeding. Bowel function is normal. Bladder function is normal. Patient is sexually active. Contraception method is condoms. Postpartum depression screening: negative. Discussed other BC, pt chooses pills  Current Outpatient Medications:  .  ferrous sulfate 325 (65 FE) MG tablet, Take 1 tablet (325 mg total) by mouth 2 (two) times daily with a meal., Disp: 30 tablet, Rfl: 3 .  hydrochlorothiazide (HYDRODIURIL) 25 MG tablet, Take 1 tablet (25 mg total) by mouth daily., Disp: 30 tablet, Rfl: 1 .  ibuprofen (ADVIL,MOTRIN) 600 MG tablet, Take 1 tablet (600 mg total) by mouth every 6 (six) hours., Disp: 30 tablet, Rfl: 0 .  prenatal vitamin w/FE, FA (PRENATAL 1 + 1) 27-1 MG TABS tablet, Take 1 tablet by mouth daily at 12 noon., Disp: 30 each, Rfl: 12  Review of Systems   Constitutional: Negative for fever and chills Eyes: Negative for visual disturbances Respiratory: Negative for shortness of breath, dyspnea Cardiovascular: Negative for chest pain or palpitations  Gastrointestinal: Negative for vomiting, diarrhea and constipation Genitourinary: Negative for dysuria and urgency Musculoskeletal: Negative for back pain, joint pain, myalgias  Neurological: Negative for dizziness and headaches    Objective:     Vitals:   05/30/17 0920  BP: 112/76  Pulse: 74   General:  alert, cooperative and no distress   Breasts:  negative  Lungs: Normal respiratory effort  Heart:  regular rate and rhythm  Abdomen: Soft, nontender   Vulva:  normal  Vagina: normal vagina  Cervix:  closed  Corpus: Well  involuted     Rectal Exam: no hemorrhoids        Assessment:    normal postpartum exam.  Plan:   1. Contraception: OCP (estrogen/progesterone)Start Sunday after baby is 346 weeks old Check FBS over the next few weeks, if >90, let me know 2. Follow up in:   or as needed.

## 2018-12-19 ENCOUNTER — Other Ambulatory Visit: Payer: Self-pay

## 2018-12-19 ENCOUNTER — Ambulatory Visit
Admission: EM | Admit: 2018-12-19 | Discharge: 2018-12-19 | Disposition: A | Discharge status: Home / Self Care | Payer: Self-pay | Attending: Emergency Medicine | Admitting: Emergency Medicine

## 2018-12-19 DIAGNOSIS — Y990 Civilian activity done for income or pay: Secondary | ICD-10-CM

## 2018-12-19 DIAGNOSIS — M62838 Other muscle spasm: Secondary | ICD-10-CM

## 2018-12-19 DIAGNOSIS — M542 Cervicalgia: Secondary | ICD-10-CM

## 2018-12-19 DIAGNOSIS — S161XXA Strain of muscle, fascia and tendon at neck level, initial encounter: Secondary | ICD-10-CM

## 2018-12-19 MED ORDER — NAPROXEN 500 MG PO TABS: 500.0000 mg | ORAL_TABLET | Freq: Two times a day (BID) | ORAL | 0 refills | Status: DC

## 2018-12-19 MED ORDER — CYCLOBENZAPRINE HCL 10 MG PO TABS: 10.0000 mg | ORAL_TABLET | Freq: Every day | ORAL | 0 refills | Status: DC

## 2018-12-19 NOTE — Discharge Instructions (Signed)
Sntomas compatibles con tensin / espasmo Continuar con el manejo conservador del descanso, el hielo, el calor y los estiramientos / [place] naproxeno segn sea necesario para Best boy (puede causar malestar abdominal, lceras y Pensions consultant gastrointestinales, evite tomarlo con otros AINE) Tome ciclobenzaprina por la noche para Public house manager los sntomas. Evite conducir u operar maquinaria pesada mientras [place] medicamentos. Haga un seguimiento con su PCP u ortopedista si los sntomas persisten Regrese o vaya a la sala de emergencias si tiene sntomas nuevos o que empeoran (fiebre, escalofros, dolor en el pecho, entumecimiento / hormigueo, debilidad, sntomas que empeoran a pesar de [employer], etc.)

## 2018-12-19 NOTE — ED Provider Notes (Signed)
[facility] CARE CENTER   [id number] 12/19/18 Arrival Time: 1446  CC: Neck and RT foot pain; work injury  SUBJECTIVE: HPI obtained using stratus interpreter: Eileen [name] [phone]  History from: patient. Eileen [name] is a 38 y.o. female complains of left sided neck and RT foot pain that began earlier today.  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  Symptoms began after she twisted her RT ankle after stepping on a broom on the ground and almost fell forward.  Denies specific injury to neck or fall on neck, but states she must have strained her neck while regaining balance.  Localizes the pain to the left side of neck.  Describes the pain as constant and "bothersome" in character.  "10"/10.  Has NOT tried OTC medications.  Symptoms are made worse with turning to look over shoulder.  Denies similar symptoms in the past. Complains of left sided neck swelling, and left sided arm weakness. Denies fever, chills, erythema, ecchymosis, numbness and tingling, chest pain, SOB.    ROS: As per HPI.  All other pertinent ROS negative.     Past Medical History:  Diagnosis Date  . Abscess 02/29/2016  . Cyst of ovary, right 11/29/2015  . History of PCOS   . Medical history non-contributory    Past Surgical History:  Procedure Laterality Date  . APPENDECTOMY    . CHOLECYSTECTOMY N/A 05/02/2016   Procedure: LAPAROSCOPIC CHOLECYSTECTOMY;  Surgeon: Franky Macho, MD;  Location: [facility];  Service: General;  Laterality: N/A;  . INCISION AND DRAINAGE     right outer leg   No Known Allergies No current facility-administered medications on file prior to encounter.    Current Outpatient Medications on File Prior to Encounter  Medication Sig Dispense Refill  . ibuprofen (ADVIL,MOTRIN) 600 MG tablet Take 1 tablet (600 mg total) by mouth every 6 (six) hours. 30 tablet 0  . prenatal vitamin w/FE, FA (PRENATAL 1 + 1) 27-1 MG TABS tablet Take 1 tablet by mouth daily at 12 noon. 30 each 12   . [DISCONTINUED] ferrous sulfate 325 (65 FE) MG tablet Take 1 tablet (325 mg total) by mouth 2 (two) times daily with a meal. 30 tablet 3  . [DISCONTINUED] hydrochlorothiazide (HYDRODIURIL) 25 MG tablet Take 1 tablet (25 mg total) by mouth daily. 30 tablet 1  . [DISCONTINUED] norgestimate-ethinyl estradiol (ORTHO-CYCLEN,SPRINTEC,PREVIFEM) 0.25-35 MG-MCG tablet Take 1 tablet by mouth daily. 1 Package 11   Social History   Socioeconomic History  . Marital status: Married    Spouse name: Not on file  . Number of children: Not on file  . Years of education: Not on file  . Highest education level: Not on file  Occupational History  . Not on file  Social Needs  . Financial resource strain: Not on file  . Food insecurity    Worry: Not on file    Inability: Not on file  . Transportation needs    Medical: Not on file    Non-medical: Not on file  Tobacco Use  . Smoking status: Never Smoker  . Smokeless tobacco: Never Used  Substance and Sexual Activity  . Alcohol use: No    Frequency: Never    Comment: sometimes; not now  . Drug use: No  . Sexual activity: Yes    Birth control/protection: None, Condom  Lifestyle  . Physical activity    Days per week: Not on file    Minutes per session: Not on file  . Stress: Not on  file  Relationships  . Social Herbalist on phone: Not on file    Gets together: Not on file    Attends religious service: Not on file    Active member of club or organization: Not on file    Attends meetings of clubs or organizations: Not on file    Relationship status: Not on file  . Intimate partner violence    Fear of current or ex partner: Not on file    Emotionally abused: Not on file    Physically abused: Not on file    Forced sexual activity: Not on file  Other Topics Concern  . Not on file  Social History Narrative   ** Merged History Encounter **       Family History  Problem Relation Age of Onset  . Diabetes Mother   . Arthritis  Father   . Diabetes Sister   . Diabetes Brother   . Arthritis/Rheumatoid Father     OBJECTIVE:  There were no vitals filed for this visit.  General appearance: ALERT; in no acute distress.  Head: NCAT ENT: PERRL, EOMI grossly; oropharynx clear Lungs: Normal respiratory effort; CTAB CV: RRR Musculoskeletal: Neck/ RT foot Inspection: Skin warm, dry, clear and intact without obvious erythema, effusion, or ecchymosis.  Palpation: Diffusely TTP over left superior trapezius with palpable spasm; mildly TTP over distal fourth/ fifth MTs ROM: FROM active and passive Strength: 5/5 shld abduction, 5/5 shld adduction, 5/5 elbow flexion, 5/5 elbow extension, 5/5 grip strength,  5/5 dorsiflexion, 5/5 plantar flexion Skin: warm and dry Neurologic: Ambulates without difficulty; Sensation intact about the upper extremities Psychological: alert and cooperative; normal mood and affect   ASSESSMENT & PLAN:  1. Neck pain on left side   2. Strain of neck muscle, initial encounter   3. Trapezius muscle spasm   4. Work related injury     Meds ordered this encounter  Medications  . naproxen (NAPROSYN) 500 MG tablet    Sig: Take 1 tablet (500 mg total) by mouth 2 (two) times daily.    Dispense:  30 tablet    Refill:  0    Order Specific Question:   Supervising Provider    Answer:   Raylene Everts [[id number]]  . cyclobenzaprine (FLEXERIL) 10 MG tablet    Sig: Take 1 tablet (10 mg total) by mouth at bedtime.    Dispense:  15 tablet    Refill:  0    Order Specific Question:   Supervising Provider    Answer:   Raylene Everts [[id number]]   Symptoms consistent with strain/ spasm Continue conservative management of rest, ice, heat, and gentle stretches/ massage Take naproxen as needed for pain relief (may cause abdominal discomfort, ulcers, and GI bleeds avoid taking with other NSAIDs) Take cyclobenzaprine at nighttime for symptomatic relief. Avoid driving or operating heavy machinery while  using medication. Follow up with PCP or orthopedist if symptoms persists Return or go to the ER if you have any new or worsening symptoms (fever, chills, chest pain, numbness/ tingling, weakness, worsening symptoms despite medications, etc...)   Reviewed expectations re: course of current medical issues. Questions answered. Outlined signs and symptoms indicating need for more acute intervention. Patient verbalized understanding. After Visit Summary given.    Lestine Box, PA-C 12/19/18 1600

## 2018-12-19 NOTE — ED Triage Notes (Signed)
Pt also states her right foot is sore

## 2018-12-19 NOTE — ED Triage Notes (Signed)
Pt tripped over broom handle  and as she as she fell forward it strained her neck, pt did not fall

## 2019-05-01 IMAGING — US US OB TRANSVAGINAL
1 series · 14 of 28 positions shown · non-contrast
Comparison: 09/03/2016

CLINICAL DATA: Seven weeks pregnant.  Vaginal bleeding.

EXAM:
OBSTETRIC <14 WK US AND TRANSVAGINAL OB US
TECHNIQUE: Both transabdominal and transvaginal ultrasound examinations were
performed for complete evaluation of the gestation as well as the
maternal uterus, adnexal regions, and pelvic cul-de-sac.
Transvaginal technique was performed to assess early pregnancy.

[Series 1: us ob transvaginal · 0.10mm/px · 47 acquisitions, 14 frames shown]
[im 2/47]
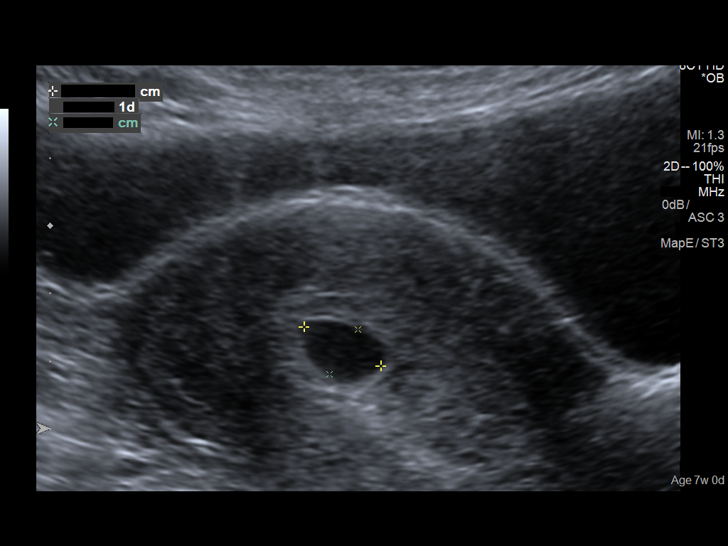
[im 6/47]
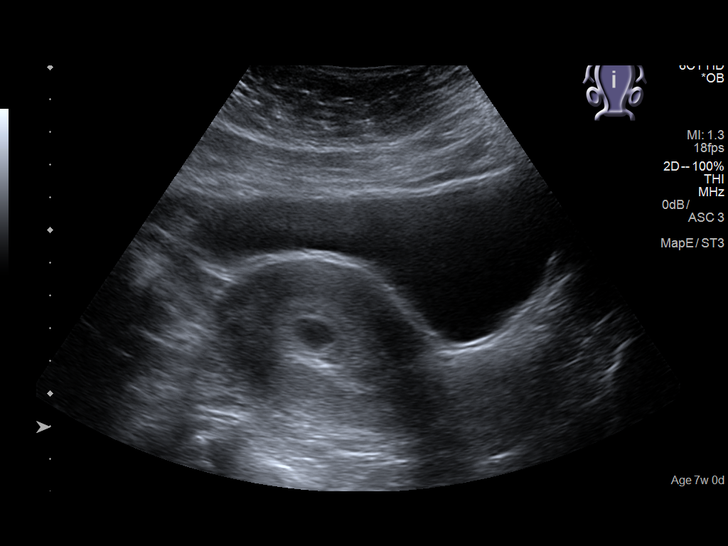
[im 9/47]
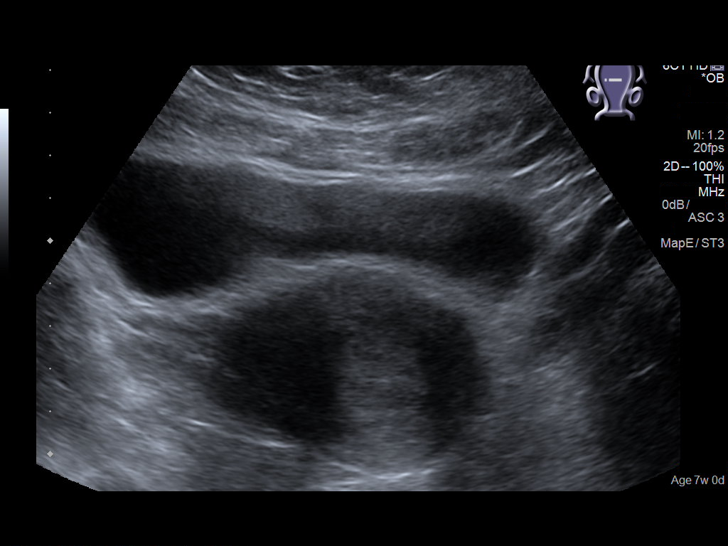
[im 12/47]
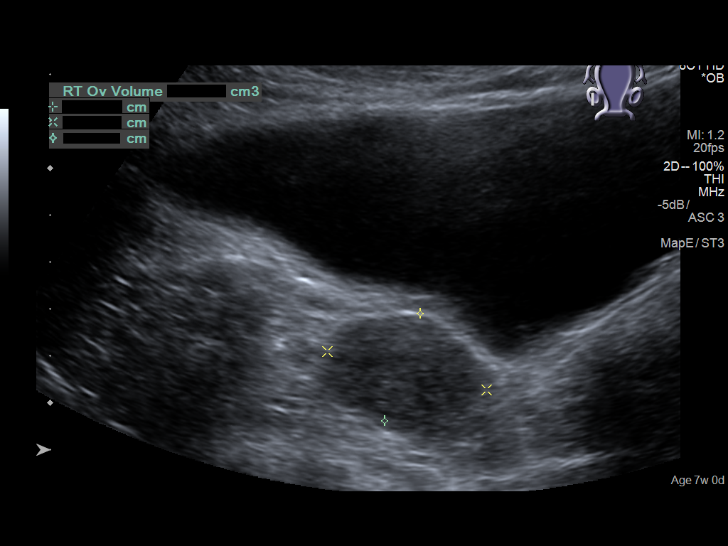
[im 16/47]
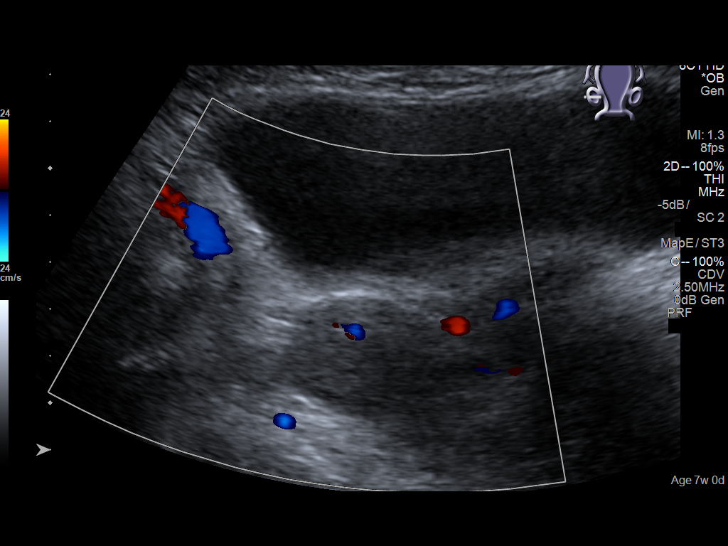
[im 19/47]
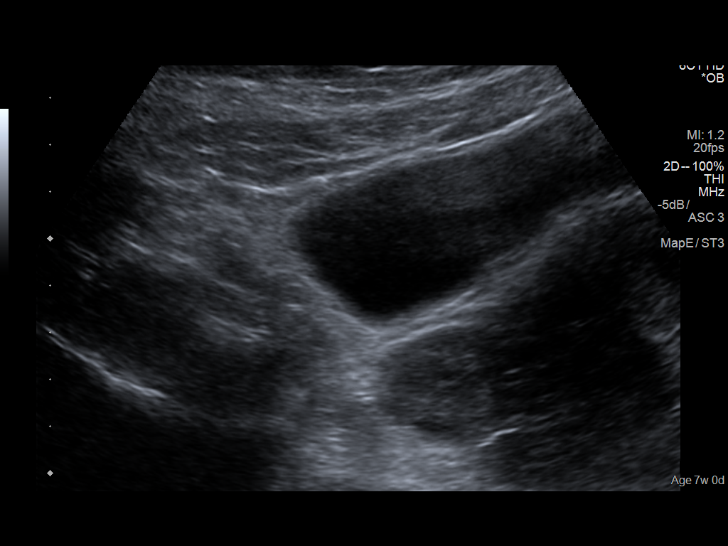
[im 23/47]
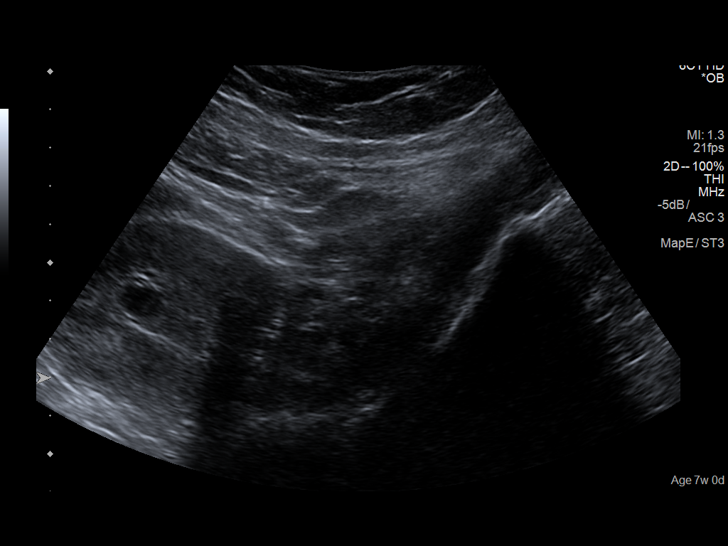
[im 26/47]
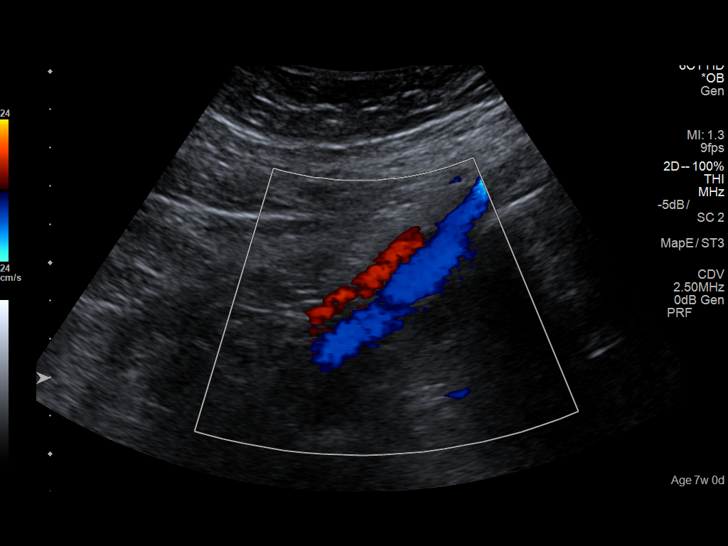
[im 29/47]
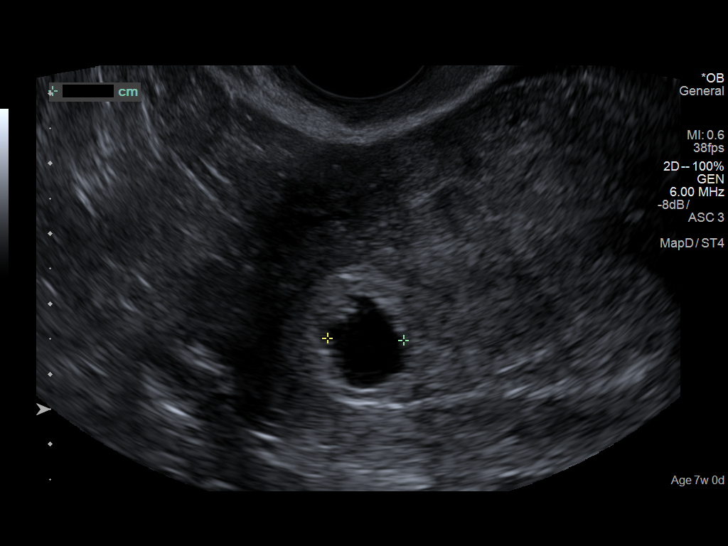
[im 33/47]
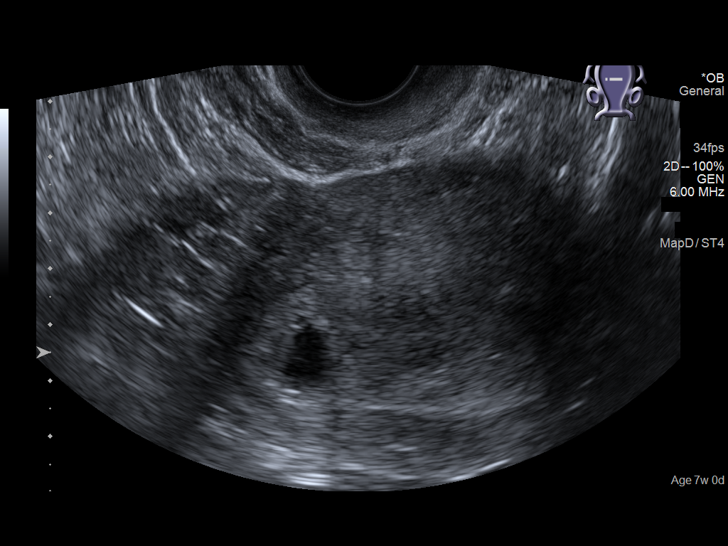
[im 36/47]
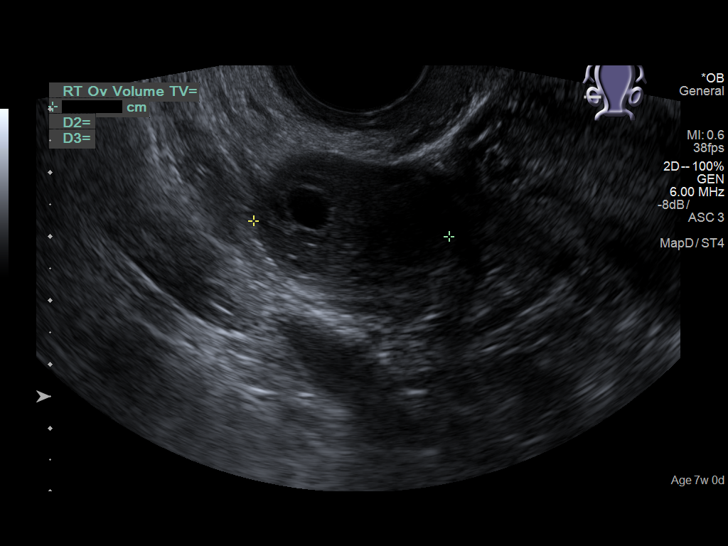
[im 40/47]
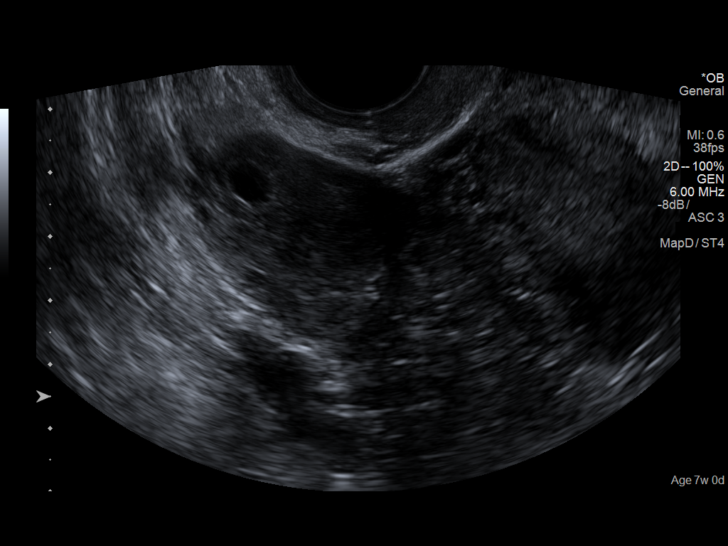
[im 43/47]
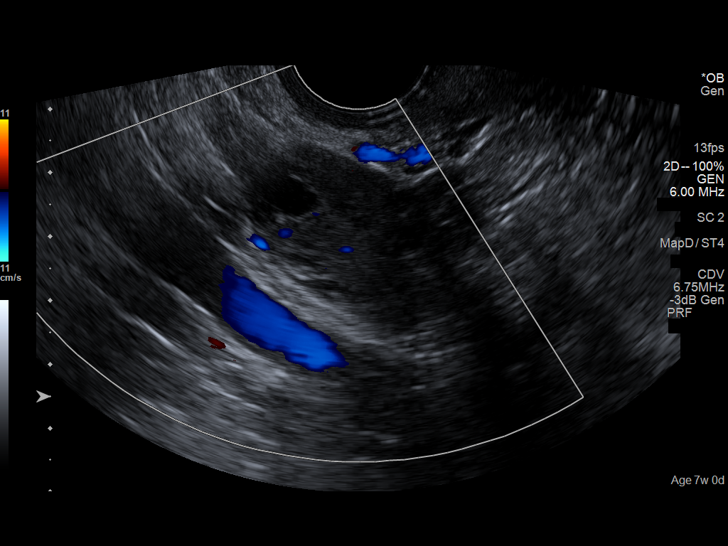
[im 47/47]
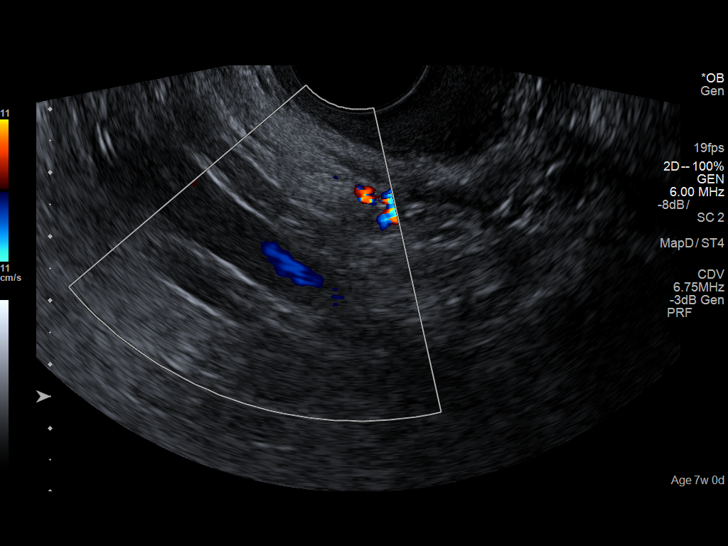

[14 of 28 positions shown; findings below may reference images not displayed]

FINDINGS: Intrauterine gestational sac: Single

Yolk sac:  Visualized.

Embryo: Yolk sac has a signet ring appearance on image 32 and 28,
but a definite embryo is not visible. The interval between follow-up
scans is less than 7 days.

MSD: 12.9  mm   6 w   1  d - progressed in size as expected

Subchorionic hemorrhage:  None visualized.

Maternal uterus/adnexae: Left ovary is not seen. Physiologic
appearance of the right ovary. No adnexal mass or pelvic fluid.
IMPRESSION: 1. Intrauterine gestational sac measuring 6 weeks 1 day, with newly
seen yolk sac. An early embryo could be present but is not
definitive. Recommend follow-up to assess viability.
2. No subchorionic hematoma or other acute finding.

## 2019-05-08 ENCOUNTER — Other Ambulatory Visit: Payer: Self-pay

## 2019-05-08 ENCOUNTER — Emergency Department (HOSPITAL_COMMUNITY)
Admission: EM | Admit: 2019-05-08 | Discharge: 2019-05-08 | Disposition: A | Discharge status: Home / Self Care | Payer: Self-pay | Attending: Emergency Medicine | Admitting: Emergency Medicine

## 2019-05-08 ENCOUNTER — Encounter (HOSPITAL_COMMUNITY): Payer: Self-pay | Admitting: Emergency Medicine

## 2019-05-08 ENCOUNTER — Emergency Department (HOSPITAL_COMMUNITY): Payer: Self-pay

## 2019-05-08 DIAGNOSIS — N83201 Unspecified ovarian cyst, right side: Secondary | ICD-10-CM | POA: Insufficient documentation

## 2019-05-08 DIAGNOSIS — N390 Urinary tract infection, site not specified: Secondary | ICD-10-CM | POA: Insufficient documentation

## 2019-05-08 LAB — CBC WITH DIFFERENTIAL/PLATELET
Abs Immature Granulocytes: 0.01 10*3/uL (ref 0.00–0.07)
Basophils Absolute: 0 10*3/uL (ref 0.0–0.1)
Basophils Relative: 0 %
Eosinophils Absolute: 0.1 10*3/uL (ref 0.0–0.5)
Eosinophils Relative: 1 %
HCT: 43.5 % (ref 36.0–46.0)
Hemoglobin: 14.4 g/dL (ref 12.0–15.0)
Immature Granulocytes: 0 %
Lymphocytes Relative: 44 %
Lymphs Abs: 2.8 10*3/uL (ref 0.7–4.0)
MCH: 29.3 pg (ref 26.0–34.0)
MCHC: 33.1 g/dL (ref 30.0–36.0)
MCV: 88.6 fL (ref 80.0–100.0)
Monocytes Absolute: 0.4 10*3/uL (ref 0.1–1.0)
Monocytes Relative: 7 %
Neutro Abs: 3 10*3/uL (ref 1.7–7.7)
Neutrophils Relative %: 48 %
Platelets: 281 10*3/uL (ref 150–400)
RBC: 4.91 MIL/uL (ref 3.87–5.11)
RDW: 12.8 % (ref 11.5–15.5)
WBC: 6.3 10*3/uL (ref 4.0–10.5)
nRBC: 0 % (ref 0.0–0.2)

## 2019-05-08 LAB — COMPREHENSIVE METABOLIC PANEL
ALT: 37 U/L (ref 0–44)
AST: 22 U/L (ref 15–41)
Albumin: 4.1 g/dL (ref 3.5–5.0)
Alkaline Phosphatase: 65 U/L (ref 38–126)
Anion gap: 8 (ref 5–15)
BUN: 13 mg/dL (ref 6–20)
CO2: 26 mmol/L (ref 22–32)
Calcium: 9.1 mg/dL (ref 8.9–10.3)
Chloride: 103 mmol/L (ref 98–111)
Creatinine, Ser: 0.64 mg/dL (ref 0.44–1.00)
GFR calc Af Amer: >60 mL/min (ref >60)
GFR calc non Af Amer: >60 mL/min (ref >60)
Glucose, Bld: 103 mg/dL — ABNORMAL HIGH (ref 70–99)
Potassium: 4.2 mmol/L (ref 3.5–5.1)
Sodium: 137 mmol/L (ref 135–145)
Total Bilirubin: 0.5 mg/dL (ref 0.3–1.2)
Total Protein: 7.2 g/dL (ref 6.5–8.1)

## 2019-05-08 LAB — URINALYSIS, ROUTINE W REFLEX MICROSCOPIC
Bilirubin Urine: NEGATIVE
Glucose, UA: NEGATIVE mg/dL
Ketones, ur: NEGATIVE mg/dL
Nitrite: NEGATIVE
Protein, ur: 30 mg/dL — AB
Specific Gravity, Urine: 1.02 (ref 1.005–1.030)
WBC, UA: >50 WBC/hpf — ABNORMAL HIGH (ref 0–5)
pH: 5 (ref 5.0–8.0)

## 2019-05-08 LAB — HCG, SERUM, QUALITATIVE: Preg, Serum: NEGATIVE

## 2019-05-08 MED ORDER — SULFAMETHOXAZOLE-TRIMETHOPRIM 800-160 MG PO TABS: 1.0000 | ORAL_TABLET | Freq: Two times a day (BID) | ORAL | 0 refills | Status: DC

## 2019-05-08 MED ORDER — SULFAMETHOXAZOLE-TRIMETHOPRIM 800-160 MG PO TABS
1.0000 | ORAL_TABLET | Freq: Once | ORAL | Status: AC
  Administered 2019-05-08: 1 via ORAL
  Filled 2019-05-08: qty 1

## 2019-05-08 MED ORDER — MORPHINE SULFATE (PF) 4 MG/ML IV SOLN
4.0000 mg | Freq: Once | INTRAVENOUS | Status: AC
Start: 2019-05-08 — End: 2019-05-08
  Administered 2019-05-08: 22:00:00 4 mg via INTRAVENOUS
  Filled 2019-05-08: qty 1

## 2019-05-08 MED ORDER — PHENAZOPYRIDINE HCL 200 MG PO TABS: 200.0000 mg | ORAL_TABLET | Freq: Three times a day (TID) | ORAL | 0 refills | Status: DC

## 2019-05-08 MED ORDER — PHENAZOPYRIDINE HCL 100 MG PO TABS
200.0000 mg | ORAL_TABLET | Freq: Once | ORAL | Status: AC
  Administered 2019-05-08: 200 mg via ORAL
  Filled 2019-05-08: qty 2

## 2019-05-08 MED ORDER — ONDANSETRON HCL 4 MG/2ML IJ SOLN
4.0000 mg | Freq: Once | INTRAMUSCULAR | Status: AC
  Administered 2019-05-08: 22:00:00 4 mg via INTRAVENOUS
  Filled 2019-05-08: qty 2

## 2019-05-08 NOTE — Discharge Instructions (Addendum)
You are being treated for a urinary tract infection.  Take the entire course of the antibiotics prescribed, taking your next dose tomorrow morning.  You may also use the pyridium prescribed which can help with bladder pain (this will turn your urine bright orange and is normal).  You have a suspected ovarian cyst at your right ovary which I do not suspect is the source of your pain today, however, it is large enough that it is recommended you have an ultrasound for better evaluation of this finding.  Call Dr. Despina Hidden (at [facility]) to arrange an office visit to have this further evaluated.

## 2019-05-08 NOTE — ED Triage Notes (Signed)
Pt c/o right sided flank pain since this AM

## 2019-05-08 NOTE — ED Notes (Signed)
Pt transported to CT ?

## 2019-05-09 NOTE — ED Provider Notes (Signed)
[facility] EMERGENCY DEPARTMENT Provider Note   CSN: [id number] Arrival date & time: 05/08/19  1840     History Chief Complaint  Patient presents with  . Flank Pain    Eileen [name] is a 39 y.o. female with a history significant for PCOS presenting with a one day history of right upper quadrant pain which radiates to her right flank in association with dysuria, describing burning pain with urination along with suprapubic pressure and increased urinary frequency.   Her symptoms started gradually early this morning.  She reports nausea without emesis, also no fevers or chills. Denies personal or family history of kidney stones.  Currently day 2 of menses so unable to comment on hematuria. Denies pregnancy and has no vaginal discharge.  She has found no alleviators for her sx,  has had no medications prior to arrival.    The history is provided by the patient. The history is limited by a language barrier. A language interpreter was used.       Past Medical History:  Diagnosis Date  . Abscess 02/29/2016  . Cyst of ovary, right 11/29/2015  . History of PCOS   . Medical history non-contributory     Patient Active Problem List   Diagnosis Date Noted  . PROM (premature rupture of membranes) 04/18/2017  . Gestational diabetes mellitus, class A1 04/18/2017  . AMA (advanced maternal age) multigravida 35+ 04/18/2017  . Mother positive for group B Streptococcus colonization 04/18/2017  . Gestational diabetes mellitus, class A1 02/20/2017  . 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 Supervision of high risk pregnancy, antepartum 09/26/2016  . AMA (advanced maternal age) multigravida 35+ 09/26/2016  . Threatened abortion in early pregnancy 09/04/2016  . Acute cholecystitis 05/01/2016  . Cholecystitis 05/01/2016  . History of miscarriage 03/12/2016  . Abscess 02/29/2016  . Cyst of ovary, right 11/29/2015    Past Surgical History:  Procedure Laterality Date  . APPENDECTOMY    .  CHOLECYSTECTOMY N/A 05/02/2016   Procedure: LAPAROSCOPIC CHOLECYSTECTOMY;  Surgeon: Franky Macho, MD;  Location: [facility];  Service: General;  Laterality: N/A;  . INCISION AND DRAINAGE     right outer leg     OB History    Gravida  3   Para  1   Term  1   Preterm  0   AB  2   Living  1     SAB  2   TAB  0   Ectopic  0   Multiple  0   Live Births  1           Family History  Problem Relation Age of Onset  . Diabetes Mother   . Arthritis Father   . Diabetes Sister   . Diabetes Brother   . Arthritis/Rheumatoid Father     Social History   Tobacco Use  . Smoking status: Never Smoker  . Smokeless tobacco: Never Used  Substance Use Topics  . Alcohol use: No    Comment: sometimes; not now  . Drug use: No    Home Medications Prior to Admission medications   Medication Sig Start Date End Date Taking? Authorizing Provider  cyclobenzaprine (FLEXERIL) 10 MG tablet Take 1 tablet (10 mg total) by mouth at bedtime. 12/19/18   Wurst, [place], PA-C  ibuprofen (ADVIL,MOTRIN) 600 MG tablet Take 1 tablet (600 mg total) by mouth every 6 (six) hours. 04/21/17   Willodean Rosenthal, MD  naproxen (NAPROSYN) 500 MG tablet Take 1 tablet (500 mg total) by mouth 2 (  two) times daily. 12/19/18   Wurst, [place], PA-C  phenazopyridine (PYRIDIUM) 200 MG tablet Take 1 tablet (200 mg total) by mouth 3 (three) times daily. 05/08/19   Evalee Jefferson, PA-C  prenatal vitamin w/FE, FA (PRENATAL 1 + 1) 27-1 MG TABS tablet Take 1 tablet by mouth daily at 12 noon. 08/27/16   Estill Dooms, NP  sulfamethoxazole-trimethoprim (BACTRIM DS) 800-160 MG tablet Take 1 tablet by mouth 2 (two) times daily for 10 days. 05/08/19 05/18/19  Evalee Jefferson, PA-C  ferrous sulfate 325 (65 FE) MG tablet Take 1 tablet (325 mg total) by mouth 2 (two) times daily with a meal. 04/21/17 12/19/18  Lavonia Drafts, MD  hydrochlorothiazide (HYDRODIURIL) 25 MG tablet Take 1 tablet (25 mg total) by mouth daily. 04/21/17  12/19/18  Lavonia Drafts, MD  norgestimate-ethinyl estradiol (ORTHO-CYCLEN,SPRINTEC,PREVIFEM) 0.25-35 MG-MCG tablet Take 1 tablet by mouth daily. 05/30/17 12/19/18  Christin Fudge, CNM    Allergies    Patient has no known allergies.  Review of Systems   Review of Systems  Constitutional: Negative for chills and fever.  HENT: Negative for congestion and sore throat.   Eyes: Negative.   Respiratory: Negative for chest tightness and shortness of breath.   Cardiovascular: Negative for chest pain.  Gastrointestinal: Positive for abdominal pain and nausea. Negative for vomiting.  Genitourinary: Positive for dysuria, flank pain and frequency. Negative for vaginal discharge.  Musculoskeletal: Negative for arthralgias, joint swelling and neck pain.  Skin: Negative.  Negative for rash and wound.  Neurological: Negative for dizziness, weakness, light-headedness, numbness and headaches.  Psychiatric/Behavioral: Negative.     Physical Exam Updated Vital Signs BP 107/69 (BP Location: Left Arm)   Pulse 69   Temp 98.2 F (36.8 C) (Oral)   Resp 19   Ht 5' (1.524 m)   Wt 108.9 kg   SpO2 99%   BMI 46.87 kg/m   Physical Exam Vitals and nursing note reviewed.  Constitutional:      Appearance: She is well-developed.  HENT:     Head: Normocephalic and atraumatic.  Eyes:     Conjunctiva/sclera: Conjunctivae normal.  Cardiovascular:     Rate and Rhythm: Normal rate and regular rhythm.     Heart sounds: Normal heart sounds.  Pulmonary:     Effort: Pulmonary effort is normal.     Breath sounds: Normal breath sounds. No wheezing.  Abdominal:     General: Bowel sounds are normal.     Palpations: Abdomen is soft.     Tenderness: There is abdominal tenderness in the right upper quadrant. There is no right CVA tenderness, left CVA tenderness, guarding or rebound. Negative signs include Murphy's sign.  Musculoskeletal:        General: Normal range of motion.     Cervical  back: Normal range of motion.  Skin:    General: Skin is warm and dry.  Neurological:     Mental Status: She is alert.     ED Results / Procedures / Treatments   Labs (all labs ordered are listed, but only abnormal results are displayed) Labs Reviewed  COMPREHENSIVE METABOLIC PANEL - Abnormal; Notable for the following components:      Result Value   Glucose, Bld 103 (*)    All other components within normal limits  URINALYSIS, ROUTINE W REFLEX MICROSCOPIC - Abnormal; Notable for the following components:   APPearance CLOUDY (*)    Hgb urine dipstick LARGE (*)    Protein, ur 30 (*)    Leukocytes,Ua MODERATE (*)  WBC, UA >50 (*)    Bacteria, UA FEW (*)    All other components within normal limits  URINE CULTURE  CBC WITH DIFFERENTIAL/PLATELET  HCG, SERUM, QUALITATIVE    EKG None  Radiology CT Renal Stone Study  Result Date: 05/08/2019 CLINICAL DATA:  Right flank pain today with hematuria. EXAM: CT ABDOMEN AND PELVIS WITHOUT CONTRAST TECHNIQUE: Multidetector CT imaging of the abdomen and pelvis was performed following the standard protocol without IV contrast. Automatic exposure control utilized. COMPARISON:  May 01, 2016 and May 27, 2004 FINDINGS: Lower chest: Small hiatal hernia. Mild cardiomegaly with central vascular congestion. Benign subpleural fat deposition. Nonspecific ground-glass opacities in the dependent portions of both lungs, probably atelectatic. Hepatobiliary: Mild hepatomegaly with fatty infiltration and a probable focal fatty sparing in the gallbladder fossa. Cholecystectomy. Normal biliary tree. Pancreas: Normal. Spleen: Normal. Adrenals/Urinary Tract: Normal bilateral adrenal glands, kidneys and ureters. Subtle perivesicular inflammatory change and mild diffuse wall thickening with an intraluminal 10 mm soft tissue density that is not contiguous with the bladder walls, probably representing a small hemorrhagic clot given the history of hematuria.  Stomach/Bowel: Normal appendix (series 5, image 52). Moderate stool burden in the proximal colon. No obstruction or apparent mucosal thickening. 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 No abdominal aorta calcified atherosclerosis or aneurysmal dilatation. Benign-sized lymph nodes. Reproductive: A partially characterized 5.3 x 4.2 x 5.2 cm 3 Hounsfield unit cyst in the right adnexa probably an exophytic ovarian cyst versus a mesenteric cyst. A 3 cm cyst was present in the same region on both prior studies. Other: Small periumbilical herniation of fat without strangulation or incarceration. Musculoskeletal: Small right femoral head bone island. Minimal vertebral body degenerative endplate change in the lower thoracic spine. IMPRESSION: Imaging findings that would support a clinical diagnosis of an acute nonspecific cystitis with probable small blood clot within the urinary bladder lumen. Normal bilateral kidneys and ureters. A 5.3 cm cyst in the right adnexa probably an exophytic ovarian cyst versus a mesenteric cyst. A 3 cm cyst was present in this region on the prior 2018 and 2006 studies. Further characterization with pelvis ultrasonography recommended. Mild hepatomegaly and fatty infiltration of the liver with probable focal fatty sparing in the gallbladder fossa. Small hiatal hernia. Electronically Signed   By: Laurence Ferrari   On: 05/08/2019 22:55    Procedures Procedures (including critical care time)  Medications Ordered in ED Medications  morphine 4 MG/ML injection 4 mg (4 mg Intravenous Given 05/08/19 2150)  ondansetron (ZOFRAN) injection 4 mg (4 mg Intravenous Given 05/08/19 2150)  phenazopyridine (PYRIDIUM) tablet 200 mg (200 mg Oral Given 05/08/19 2345)  sulfamethoxazole-trimethoprim (BACTRIM DS) 800-160 MG per tablet 1 tablet (1 tablet Oral Given 05/08/19 2345)    ED Course  I have reviewed the triage vital signs and the  nursing notes.  Pertinent labs & imaging results that were available during my care of the patient were reviewed by me and considered in my medical decision making (see chart for details).    MDM Rules/Calculators/A&P                      Pt treated for acute cystitis, urine culture ordered.  bactrim and pyridium started here.  Labs reviewed and discussed, lft's normal, no CT findings to suggest gallbladder pathology and neg Murphy sign, doubt sx are related to gallbladder disease. No kidney stones present. Discussed right ovarian cyst and need  for US imaging.  Referred to [facility] for this work up.  Pt understands and agrees with plan.  Return precautions outlined.  Final Clinical Impression(s) / ED Diagnoses Final diagnoses:  Lower urinary tract infectious disease  Cyst of right ovary    Rx / DC Orders ED Discharge Orders         Ordered    phenazopyridine (PYRIDIUM) 200 MG tablet  3 times daily     05/08/19 2319    sulfamethoxazole-trimethoprim (BACTRIM DS) 800-160 MG tablet  2 times daily     05/08/19 2319           Burgess Amor, PA-C 05/10/19 1056    Bethann Berkshire, MD 05/12/19 1030

## 2019-05-10 LAB — URINE CULTURE: Culture: >=100000 — AB

## 2019-05-11 ENCOUNTER — Telehealth: Payer: Self-pay

## 2019-05-11 NOTE — Telephone Encounter (Signed)
RE: [facility] ED 05/08/19 Pt instructed over the phone with Interpreter by Pharm D and PA Petru[place]i  To continue abx and to return is worsening symptoms

## 2019-05-12 NOTE — Telephone Encounter (Signed)

## 2019-05-13 ENCOUNTER — Other Ambulatory Visit: Payer: Self-pay

## 2019-05-13 ENCOUNTER — Ambulatory Visit: Payer: Self-pay | Admitting: Obstetrics and Gynecology

## 2019-05-13 ENCOUNTER — Encounter: Payer: Self-pay | Admitting: Obstetrics and Gynecology

## 2019-05-13 VITALS — BP 106/71 | HR 78 | Ht 61.0 in | Wt 254.0 lb

## 2019-05-13 DIAGNOSIS — N83201 Unspecified ovarian cyst, right side: Secondary | ICD-10-CM

## 2019-05-13 NOTE — Progress Notes (Signed)
Patient ID: Naydene Kamrowski, female   DOB: 16-Jun-1980, 39 y.o.   MRN: [id number]    [facility] Visit  @DATE @            Patient name: Dezaree Tracey MRN [id number]  Date of birth: [birth year]  CC & HPI:  Jaselle Palacios-Benitez is a 39 y.o. female presenting today for follow-up after being seen in the ED on 05/08/2019. The patient presented to the ED with complaints of right upper quadrant abdominal pain that was radiating to right flank, dysuria, nausea, and urinary frequency. CT renal stone study showed a 5.3 cm cyst in the right adnexa probably an exophytic ovarian cyst versus a mesenteric cyst. A 3 cm cyst was present in that region on the prior 2018 and 2006 studies.  1. The patient's 2018 OB ultrasound was specifically viewed and showed that her ovaries were normal in size She does not have any complaints today.  ROS:  ROS  Negative  Pertinent History Reviewed:   Reviewed Medical         Past Medical History:  Diagnosis Date  . Abscess 02/29/2016  . Cyst of ovary, right 11/29/2015  . History of PCOS   . Medical history non-contributory                               Surgical Hx:    Past Surgical History:  Procedure Laterality Date  . APPENDECTOMY    . CHOLECYSTECTOMY N/A 05/02/2016   Procedure: LAPAROSCOPIC CHOLECYSTECTOMY;  Surgeon: Aviva Signs, MD;  Location: [facility];  Service: General;  Laterality: N/A;  . INCISION AND DRAINAGE     right outer leg   Medications: Reviewed & Updated - see associated section                       Current Outpatient Medications:  .  cyclobenzaprine (FLEXERIL) 10 MG tablet, Take 1 tablet (10 mg total) by mouth at bedtime., Disp: 15 tablet, Rfl: 0 .  ibuprofen (ADVIL,MOTRIN) 600 MG tablet, Take 1 tablet (600 mg total) by mouth every 6 (six) hours., Disp: 30 tablet, Rfl: 0 .  naproxen (NAPROSYN) 500 MG tablet, Take 1 tablet (500 mg total) by mouth 2 (two) times daily., Disp: 30 tablet, Rfl: 0 .  phenazopyridine  (PYRIDIUM) 200 MG tablet, Take 1 tablet (200 mg total) by mouth 3 (three) times daily., Disp: 6 tablet, Rfl: 0 .  prenatal vitamin w/FE, FA (PRENATAL 1 + 1) 27-1 MG TABS tablet, Take 1 tablet by mouth daily at 12 noon., Disp: 30 each, Rfl: 12 .  sulfamethoxazole-trimethoprim (BACTRIM DS) 800-160 MG tablet, Take 1 tablet by mouth 2 (two) times daily for 10 days., Disp: 10 tablet, Rfl: 0   Social History: Reviewed -  reports that she has never smoked. She has never used smokeless tobacco.  Objective Findings:  Vitals: There were no vitals taken for this visit.  PHYSICAL EXAMINATION General appearance - alert, well appearing, and in no distress Mental status - normal mood, behavior, speech, dress, motor activity, and thought processes, affect appropriate to mood    Assessment & Plan:   A:  1.  Right ovarian cyst  P:  2. . Plan for transvaginal ultrasound in six weeks to follow-up.   By signing my name below, I, Clerance Lav, attest that this documentation has been prepared under the direction and in the presence of Jonnie Kind, MD. Electronically Signed:  Nikki Dom Medical Scribe. 05/13/19. 3:48 PM.  I personally performed the services described in this documentation, which was SCRIBED in my presence. The recorded information has been reviewed and considered accurate. It has been edited as necessary during review. Tilda Burrow, MD

## 2019-05-13 NOTE — Progress Notes (Signed)
See my note above.

## 2019-06-11 ENCOUNTER — Encounter: Payer: Self-pay | Admitting: Physician Assistant

## 2019-06-11 ENCOUNTER — Other Ambulatory Visit: Payer: Self-pay

## 2019-06-11 ENCOUNTER — Ambulatory Visit: Payer: Self-pay | Admitting: Physician Assistant

## 2019-06-11 VITALS — BP 100/76 | HR 62 | Temp 98.1°F | Wt 255.2 lb

## 2019-06-11 DIAGNOSIS — Z7689 Persons encountering health services in other specified circumstances: Secondary | ICD-10-CM

## 2019-06-11 DIAGNOSIS — Z789 Other specified health status: Secondary | ICD-10-CM

## 2019-06-11 NOTE — Progress Notes (Signed)
BP 100/76   Pulse 62   Temp 98.1 F (36.7 C)   Wt 255 lb 3.2 oz (115.8 kg)   SpO2 97%   BMI 48.22 kg/m    Subjective:    Patient ID: Eileen [name], female    DOB: [birth year], 39 y.o.   MRN: [id number]  HPI: Eileen [name] is a 39 y.o. female presenting on 06/11/2019 for New Patient (Initial Visit) (pt is here to establish care. pt states she is feeling well)   HPI    Pt had a negative covid 19 screening questionnaire.   Pt is 38yoF who presents to establish care  She was Seen last month by gyn for ovarian cyst.  She has appointment for follow up ultrasound in May.  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      Relevant past medical, surgical, family and social history reviewed and updated as indicated. Interim medical history since our last visit reviewed. Allergies and medications reviewed and updated.  CURRENT MEDS: None   Review of Systems  Per HPI unless specifically indicated above     Objective:    BP 100/76   Pulse 62   Temp 98.1 F (36.7 C)   Wt 255 lb 3.2 oz (115.8 kg)   SpO2 97%   BMI 48.22 kg/m   Wt Readings from Last 3 Encounters:  06/11/19 255 lb 3.2 oz (115.8 kg)  05/13/19 254 lb (115.2 kg)  05/08/19 240 lb (108.9 kg)    Physical Exam Vitals reviewed.  Constitutional:      General: She is not in acute distress.    Appearance: She is well-developed. She is obese. She is not ill-appearing.  HENT:     Head: Normocephalic and atraumatic.  Eyes:     Conjunctiva/sclera: Conjunctivae normal.     Pupils: Pupils are equal, round, and reactive to light.  Neck:     Thyroid: No thyromegaly.  Cardiovascular:     Rate and Rhythm: Normal rate and regular rhythm.  Pulmonary:     Effort: Pulmonary effort is normal.     Breath sounds: Normal breath sounds.  Abdominal:     General: Bowel sounds are normal.     Palpations: Abdomen is soft. There is no mass.     Tenderness: There is no abdominal tenderness.  Musculoskeletal:      Cervical back: Neck supple.     Right lower leg: No edema.     Left lower leg: No edema.  Lymphadenopathy:     Cervical: No cervical adenopathy.  Skin:    General: Skin is warm and dry.  Neurological:     Mental Status: She is alert and oriented to person, place, and time.     Gait: Gait normal.  Psychiatric:        Attention and Perception: Attention normal.        Speech: Speech normal.        Behavior: Behavior normal. Behavior is cooperative.            Assessment & Plan:    Encounter Diagnoses  Name Primary?  . Encounter to establish care Yes  . Morbid obesity (HCC)   . Non-English speaking patient      -Counseled contraception- offered free IUD [facility] has samples) that could be place by gyn -Counseled on weigt- recommended healthy diet and regular exercise -reviewed recent labs in Epic.  No additional labs at this time -pt to follow up 1 year.  She is to contact office sooner prn

## 2019-06-11 NOTE — Patient Instructions (Signed)
Prevencin de riesgos para la salud debido al sobrepeso Preventing Health Risks of Being Overweight Mantener un peso corporal saludable es una parte importante de su salud general. Un peso corporal saludable depende de la edad, el sexo y Agricultural consultant. Tener sobrepeso lo pone en riesgo de sufrir muchos problemas de salud, incluidos los siguientes:  Enfermedad cardaca.  Diabetes.  Problemas para dormir.  Problemas en las articulaciones. Puede realizar [employer] dieta y el estilo de vida para evitar estos riesgos. Considere trabajar con un mdico o un nutricionista para Marketing executive. Qu cambios en la alimentacin se pueden realizar?   Coma solo la cantidad que el cuerpo necesita. En la [employer], esta cantidad es de aproximadamente 2.000 caloras por da, pero la cantidad vara segn la altura, el sexo y [place] de [place]. Pregntele al mdico cuntas caloras deber ingerir por da. Comer ms de lo que el cuerpo necesita regularmente puede llevarlo a tener sobrepeso u obesidad.  Coma lentamente y deje de comer cuando se sienta satisfecho.  Elija alimentos saludables, incluidos los siguientes: ? [place] y verduras. ? [employer]on. ? Productos lcteos descremados. ? Alimentos ricos en fibra, como cereales integrales y frijoles. ? Refrigerios saludables, como bastones de verdura, un trozo de fruta o una cantidad pequea de yogur o [place].  Evite los alimentos y las bebidas con alto contenido de Location manager, sal (sodio), grasas saturadas o grasas trans. Esto incluye lo siguiente: ? Muchos postres, como caramelos, galletas y helado. ? Gaseosas. ? Comidas fritas. ? Carnes procesadas, como hot dogs o fiambres. ? Bocadillos envasados. Qu cambios en el estilo de vida se pueden realizar?   Haga ejercicio durante por lo menos 150 minutos a la semana para evitar el aumento de [place], o con la frecuencia recomendada por su mdico. Haga ejercicios de intensidad moderada, como  caminar a paso ligero. ? Distribyalo al hacer ejercicio durante 30 minutos 5 das a la [place], o rfagas cortas de 10 minutos varias veces al da.  Encuentre otras formas de mantenerse activo y Public librarian caloras, como trabajar en el jardn o un pasatiempo que involucre de [place] fsica.  Duerma como mnimo 8horas todas las noches. Al estar bien descansado, tendr ms probabilidades de estar activo fsicamente y Field seismologist elecciones saludables [place]. Para dormir mejor: ? Trate de ir a dormir y Clinical cytogeneticist a Dentist todos los [place]. ? Mantenga el dormitorio oscuro, silencioso y Company secretary. ? Asegrese de que la cama sea cmoda. ? Evite las actividades estimulantes, como ver televisin o Engineer, site, durante por lo menos una hora antes de la hora de dormir. Por qu son importantes estos cambios? Comer de forma saludable y [place] a perder peso y a prevenir los problemas de salud causados por el sobrepeso. Realizar estos cambios tambin puede ayudarlo a Scientific laboratory technician, sentirse mejor mentalmente y conectarse con amigos y familiares. Qu puede suceder si no se hacen cambios? Tener sobrepeso puede afectarlo durante toda la vida. Puede desarrollar problemas articulares u seos que harn que le resulte difcil o doloroso practicar deportes o realizar actividades que disfruta. Tener sobrepeso hace que el corazn y los pulmones se esfuercen ms y puede provocar problemas de salud, como diabetes, enfermedad cardaca y problemas para dormir. Dnde encontrar apoyo Puede obtener apoyo para la prevencin de los riesgos de la salud debido al sobrepeso por parte de:  Su mdico o un nutricionista. Ellos pueden orientarlo en cuanto a las opciones saludables de alimentacin y  de estilo de vida.  Grupos de apoyo para perder peso en lnea o en persona. Dnde buscar ms informacin  MyPlate: https://ball-collins.biz/ ? Esta una herramienta en lnea que proporciona recomendaciones  personalizadas sobre qu alimentos comer cada da.  Centers for Disease Control and Prevention (Centros para el control y la prevencin de enfermedades, CDC): AffordableScrapbook.gl ? Este recurso brinda consejos para Sales executive peso y Warehouse manager un estilo de vida Musician. Resumen  Para evitar el aumento de peso no saludable, es importante mantener una dieta saludable rica en verduras y cereales integrales, hacer ejercicio con regularidad y dormir al menos 8 horas todas las noches.  Realizar estos cambios ayuda a evitar muchas enfermedades a largo plazo (crnicas) que pueden acortar su vida, como la diabetes, enfermedad cardaca y accidente cerebrovascular. Esta informacin no tiene Theme park manager el consejo del mdico. Asegrese de hacerle al mdico cualquier pregunta que tenga. Document Revised: 03/16/2017 Document Reviewed: 03/16/2017 Elsevier Patient Education  2020 [employer].

## 2019-06-23 ENCOUNTER — Other Ambulatory Visit: Payer: Self-pay | Admitting: Obstetrics and Gynecology

## 2019-06-23 ENCOUNTER — Telehealth: Payer: Self-pay | Admitting: Obstetrics & Gynecology

## 2019-06-23 DIAGNOSIS — N83201 Unspecified ovarian cyst, right side: Secondary | ICD-10-CM

## 2019-06-23 NOTE — Telephone Encounter (Signed)

## 2019-06-24 ENCOUNTER — Ambulatory Visit (INDEPENDENT_AMBULATORY_CARE_PROVIDER_SITE_OTHER): Payer: Self-pay

## 2019-06-24 ENCOUNTER — Other Ambulatory Visit: Payer: Self-pay

## 2019-06-24 ENCOUNTER — Other Ambulatory Visit: Payer: Self-pay | Admitting: Obstetrics and Gynecology

## 2019-06-24 DIAGNOSIS — N83201 Unspecified ovarian cyst, right side: Secondary | ICD-10-CM

## 2019-06-24 NOTE — Progress Notes (Signed)
PELVIC US TA/TV: homogeneous anteverted uterus,WNL,EEC 11.9 mm,normal left ovary,simple right exophytic cyst 6.1 x 4.5 x 4.7 cm (arterial and venenous flow visualized),unable to slide left ovary,right ovary appears mobile,no free fluid,right adnexal discomfort during ultrasound  Chaperone Sabrina

## 2019-07-01 ENCOUNTER — Other Ambulatory Visit: Payer: Self-pay

## 2019-07-01 ENCOUNTER — Encounter: Payer: Self-pay | Admitting: Obstetrics and Gynecology

## 2019-07-01 ENCOUNTER — Ambulatory Visit (INDEPENDENT_AMBULATORY_CARE_PROVIDER_SITE_OTHER): Payer: Self-pay | Admitting: Obstetrics and Gynecology

## 2019-07-01 VITALS — BP 127/74 | HR 76 | Ht 61.0 in | Wt 255.4 lb

## 2019-07-01 DIAGNOSIS — N83201 Unspecified ovarian cyst, right side: Secondary | ICD-10-CM

## 2019-07-01 DIAGNOSIS — N83291 Other ovarian cyst, right side: Secondary | ICD-10-CM

## 2019-07-01 NOTE — Progress Notes (Signed)
PATIENT ID: Eileen [name], female     DOB: 1980-09-02, 39 y.o.     MRN: [id number]   [facility] Visit  07/01/19     PATIENT NAME: Eileen [name]     MRN [id number]     DOB: 05-06-1980  CC & HPI:  Eileen [name] is a 39 y.o. female presenting today for ultrasound follow up. She is accompanied by a [place] interpreter.   05/08/2019 Abdomen and Pelvis US revealed Imaging findings that would support a clinical diagnosis of an acute nonspecific cystitis with probable small blood clot within the urinary bladder lumen. Normal bilateral kidneys and ureters. A 5.3 cm cyst in the right adnexa probably an exophytic ovarian cyst versus a mesenteric cyst. A 3 cm cyst was present in this region on the prior 2018 and 2006 studies. Further characterization with pelvis ultrasonography recommended. Mild hepatomegaly and fatty infiltration of the liver with probable focal fatty sparing in the gallbladder fossa. Small hiatal hernia.  06/24/2019 TA/TV Pelvic US revealed homogeneous anteverted uterus,WNL,EEC 11.9 mm,normal left ovary,simple right exophytic cyst 6.1 x 4.5 x 4.7 cm (arterial and venenous flow visualized),unable to slide left ovary,right ovary appears mobile,no free fluid,right adnexal discomfort during ultrasound.  ROS:  Review of Systems  Constitutional: Negative for diaphoresis, fever, malaise/fatigue and weight loss.  HENT: Negative for congestion and sore throat.   Eyes: Negative for blurred vision and double vision.  Respiratory: Negative for cough and shortness of breath.   Cardiovascular: Negative for chest pain, palpitations and leg swelling.  Gastrointestinal: Positive for abdominal pain. Negative for constipation, diarrhea, nausea and vomiting.  Genitourinary: Negative for frequency and urgency.  Musculoskeletal: Negative for back pain, falls and myalgias.  Skin: Negative for rash.  Neurological: Negative for dizziness, weakness and headaches.   Psychiatric/Behavioral: Negative for depression. The patient is not nervous/anxious.      Pertinent History Reviewed:  Reviewed: Significant for minimal discomfort at end of day Medical         Past Medical History:  Diagnosis Date  . Abscess 02/29/2016  . Cyst of ovary, right 11/29/2015  . History of PCOS   . Medical history non-contributory   . Preeclampsia                               Surgical Hx:    Past Surgical History:  Procedure Laterality Date  . CHOLECYSTECTOMY N/A 05/02/2016   Procedure: LAPAROSCOPIC CHOLECYSTECTOMY;  Surgeon: Aviva Signs, MD;  Location: [facility];  Service: General;  Laterality: N/A;  . INCISION AND DRAINAGE     right outer leg   Medications: Reviewed & Updated - see associated section                      No current outpatient medications on file.   Social History: Reviewed -  reports that she has never smoked. She has never used smokeless tobacco.  Objective Findings:  Vitals: Blood pressure 127/74, pulse 76, height 5\' 1"  (1.549 m), weight 255 lb 6.4 oz (115.8 kg).  PHYSICAL EXAM not done  Assessment & Plan:   A:  1.  simple cyst right ovary,5.3 cm max diameter. Benign characteristics.  P:  2.  Repeat TV/A Pelvic Ultrasound in 6 months    By signing my name below, I, [employer], attest that this documentation has been prepared under the direction and in the presence of Jonnie Kind, MD. Electronically  Signed: Data processing manager Medical Scribe. 07/01/19. 4:08 PM.  I personally performed the services described in this documentation, which was SCRIBED in my presence. The recorded information has been reviewed and considered accurate. It has been edited as necessary during review. Tilda Burrow, MD

## 2019-10-14 ENCOUNTER — Other Ambulatory Visit: Payer: Self-pay

## 2020-03-14 ENCOUNTER — Ambulatory Visit: Payer: Self-pay | Admitting: Physician Assistant

## 2020-03-14 ENCOUNTER — Encounter: Payer: Self-pay | Admitting: Physician Assistant

## 2020-03-14 DIAGNOSIS — R509 Fever, unspecified: Secondary | ICD-10-CM

## 2020-03-14 DIAGNOSIS — Z20822 Contact with and (suspected) exposure to covid-19: Secondary | ICD-10-CM

## 2020-03-14 DIAGNOSIS — Z789 Other specified health status: Secondary | ICD-10-CM

## 2020-03-14 DIAGNOSIS — J029 Acute pharyngitis, unspecified: Secondary | ICD-10-CM

## 2020-03-14 DIAGNOSIS — R52 Pain, unspecified: Secondary | ICD-10-CM

## 2020-03-14 NOTE — Progress Notes (Signed)
   There were no vitals taken for this visit.   Subjective:    Patient ID: Eileen [name], female    DOB: [birth year], 40 y.o.   MRN: [id number]  HPI: Eileen [name] is a 40 y.o. female presenting on 03/14/2020 for No chief complaint on file.   HPI    This is a telemedicine appointment through [employer] due to coronaviru pandemic.  I connected with  Eileen [name] on 03/14/20 by a video enabled telemedicine application and verified that I am speaking with the correct person using two identifiers.   I discussed the limitations of evaluation and management by telemedicine. The patient expressed understanding and agreed to proceed.  Pt is at home.  Provider and translator are at office.    Pt is sick.  She started Tuesday with ST.  More symptoms as the days passed.  Friday she started with fever and body aches.  She is getting phenteramine  From a dr in [place].  She got 2 doses moderna covid vaccine.  her Last dose was  In April  She has Not yet gotten the covid booster.  She Works at [employer].       Relevant past medical, surgical, family and social history reviewed and updated as indicated. Interim medical history since our last visit reviewed. Allergies and medications reviewed and updated.   Current Outpatient Medications:  .  PHENTERMINE HCL PO, Take by mouth., Disp: , Rfl:     Review of Systems  Per HPI unless specifically indicated above     Objective:    There were no vitals taken for this visit.  Wt Readings from Last 3 Encounters:  07/01/19 255 lb 6.4 oz (115.8 kg)  06/11/19 255 lb 3.2 oz (115.8 kg)  05/13/19 254 lb (115.2 kg)    Physical Exam Constitutional:      General: She is not in acute distress.    Appearance: She is not toxic-appearing.     Comments: Pt looks like she doesn't feel well  HENT:     Head: Normocephalic and atraumatic.  Pulmonary:     Effort: Pulmonary effort is normal. No respiratory distress.   Neurological:     Mental Status: She is alert and oriented to person, place, and time.  Psychiatric:        Attention and Perception: Attention normal.        Speech: Speech normal.        Behavior: Behavior is cooperative.            Assessment & Plan:    Encounter Diagnoses  Name Primary?  . Person under investigation for COVID-19 Yes  . Non-English speaking patient   . Fever, unspecified fever cause   . Body aches   . Sore throat       -discussed with pt that she Needs covid testing.  Discussed options and she will get tested at [facility] -pt is counseled on self Isolation until she gets her results and is well.  She is asked to notify this office of her result as we do not get those results -Will email note to pt for work.  She is to contact office if needed

## 2020-03-16 ENCOUNTER — Telehealth: Payer: Self-pay | Admitting: Student

## 2020-03-16 NOTE — Telephone Encounter (Signed)
Pt called to notify [facility] that she, her husband, and her daughter tested positive for COVID. Pt got tested at the mobile testing site at [facility] on 03-14-20 and received her results this morning via phone. Pt symptoms started last Tuesday, 03-08-20 (see 03-14-20 OV) , but pt states she is still symptomatic and feverish (subjective). Pt is requesting work note sent to her e-mail on file.  instructions given to pt: " remain in self-quarantine until they meet the "Non-Test Criteria for Ending Self-Isolation".  Non-Test Criteria for Ending Self-Isolation All persons with fever and respiratory symptoms should isolate themselves until ALL conditions listed below are met: " at least 5 days since symptoms onset " AND 3 consecutive days fever free without antipyretics (acetaminophen [Tylenol] or ibuprofen [Advil]) " AND improvement in respiratory symptoms " If the patient develops respiratory issues/distress, seek medical care in the Emergency Department, call 911, reports symptoms and report COVID-19 positive test.  Patient Instructions " continue to utilize over-the-counter medications for fever (ibuprofen and/or Tylenol) and cough (cough medicine and/or sore throat lozenges). " wear a mask around people and follow good infection prevention techniques. " Patient to should only leave home to seek medical care. " send family for food, prescriptions or medicines; or use delivery service.  " If the patient must leave the home, they MUST wear a mask in public. " limit contact with immediate family members or caregivers in the home, and use mask, social distancing, and handwashing to decrease risk to patients. o Please continue good preventive care measures, including frequent hand washing, avoid touching your face, cover coughs/sneezes with tissue or into elbow, stay out of crowds and keep a 6-foot distance from others.   " patient and family to clean hard surfaces touched by patient frequently with household  cleaning products.  Pt is to wear a mask for at least 10 days after quarantining period  Pt verbalized understanding.  Work note sent to patients email on today, 03-16-20

## 2020-04-07 ENCOUNTER — Other Ambulatory Visit: Payer: Self-pay

## 2020-04-07 DIAGNOSIS — Z23 Encounter for immunization: Secondary | ICD-10-CM | POA: Insufficient documentation

## 2020-04-07 DIAGNOSIS — S61201A Unspecified open wound of left index finger without damage to nail, initial encounter: Secondary | ICD-10-CM | POA: Insufficient documentation

## 2020-04-07 DIAGNOSIS — W260XXA Contact with knife, initial encounter: Secondary | ICD-10-CM | POA: Insufficient documentation

## 2020-04-08 ENCOUNTER — Encounter (HOSPITAL_COMMUNITY): Payer: Self-pay | Admitting: Emergency Medicine

## 2020-04-08 ENCOUNTER — Emergency Department (HOSPITAL_COMMUNITY)
Admission: EM | Admit: 2020-04-08 | Discharge: 2020-04-08 | Disposition: A | Discharge status: Home / Self Care | Payer: Self-pay | Attending: Emergency Medicine | Admitting: Emergency Medicine

## 2020-04-08 ENCOUNTER — Other Ambulatory Visit: Payer: Self-pay

## 2020-04-08 DIAGNOSIS — S61210A Laceration without foreign body of right index finger without damage to nail, initial encounter: Secondary | ICD-10-CM

## 2020-04-08 MED ORDER — TETANUS-DIPHTH-ACELL PERTUSSIS 5-2.5-18.5 LF-MCG/0.5 IM SUSY
0.5000 mL | PREFILLED_SYRINGE | Freq: Once | INTRAMUSCULAR | Status: AC
  Administered 2020-04-08: 0.5 mL via INTRAMUSCULAR
  Filled 2020-04-08: qty 0.5

## 2020-04-08 MED ORDER — LIDOCAINE-EPINEPHRINE (PF) 2 %-1:200000 IJ SOLN
10.0000 mL | Freq: Once | INTRAMUSCULAR | Status: AC
  Administered 2020-04-08: 10 mL

## 2020-04-08 NOTE — ED Provider Notes (Signed)
[facility] EMERGENCY DEPARTMENT Provider Note   CSN: [id number] Arrival date & time: 04/07/20  2316   History Chief Complaint  Patient presents with  . Laceration    Eileen [name] is a 40 y.o. female.  The history is provided by the patient. A language interpreter was used.  Laceration She accidentally cut her right index finger with a knife.  She does not know when her last tetanus immunization was.  She denies other injury.  Past Medical History:  Diagnosis Date  . Abscess 02/29/2016  . Cyst of ovary, right 11/29/2015  . History of PCOS   . Medical history non-contributory   . Preeclampsia     Patient Active Problem List   Diagnosis Date Noted  . Gestational diabetes mellitus, class A1 04/18/2017  . AMA (advanced maternal age) multigravida 35+ 04/18/2017  . Mother positive for group B Streptococcus colonization 04/18/2017  . Gestational diabetes mellitus, class A1 02/20/2017  . 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 Supervision of high risk pregnancy, antepartum 09/26/2016  . AMA (advanced maternal age) multigravida 35+ 09/26/2016  . Acute cholecystitis 05/01/2016  . Cholecystitis 05/01/2016  . History of miscarriage 03/12/2016  . Abscess 02/29/2016  . Cyst of ovary, right 11/29/2015    Past Surgical History:  Procedure Laterality Date  . CHOLECYSTECTOMY N/A 05/02/2016   Procedure: LAPAROSCOPIC CHOLECYSTECTOMY;  Surgeon: Franky Macho, MD;  Location: [facility];  Service: General;  Laterality: N/A;  . INCISION AND DRAINAGE     right outer leg     OB History    Gravida  2   Para  1   Term  1   Preterm  0   AB  1   Living  1     SAB  1   IAB  0   Ectopic  0   Multiple  0   Live Births  1           Family History  Problem Relation Age of Onset  . Diabetes Mother   . Depression Mother   . Liver disease Mother   . Arthritis Father   . Arthritis/Rheumatoid Father   . Diabetes Sister   . Diabetes Brother   . Diabetes Brother      Social History   Tobacco Use  . Smoking status: Never Smoker  . Smokeless tobacco: Never Used  Vaping Use  . Vaping Use: Never used  Substance Use Topics  . Alcohol use: Yes    Comment: sometimes drinks tequila about one every 3 months  . Drug use: Never    Home Medications Prior to Admission medications   Medication Sig Start Date End Date Taking? Authorizing Provider  PHENTERMINE HCL PO Take by mouth.    [provider]  ferrous sulfate 325 (65 FE) MG tablet Take 1 tablet (325 mg total) by mouth 2 (two) times daily with a meal. 04/21/17 12/19/18  Willodean Rosenthal, MD  hydrochlorothiazide (HYDRODIURIL) 25 MG tablet Take 1 tablet (25 mg total) by mouth daily. 04/21/17 12/19/18  Willodean Rosenthal, MD  norgestimate-ethinyl estradiol (ORTHO-CYCLEN,SPRINTEC,PREVIFEM) 0.25-35 MG-MCG tablet Take 1 tablet by mouth daily. 05/30/17 12/19/18  Jacklyn Shell, CNM    Allergies    Patient has no known allergies.  Review of Systems   Review of Systems  All other systems reviewed and are negative.   Physical Exam Updated Vital Signs BP (!) 109/57 (BP Location: Left Arm)   Pulse 74   Temp 98.2 F (36.8 C) (Oral)   Resp 18  Ht 5' (1.524 m)   Wt 106.6 kg   SpO2 100%   BMI 45.90 kg/m   Physical Exam Vitals and nursing note reviewed.   40 year old female, resting comfortably and in no acute distress. Vital signs are normal. Oxygen saturation is 100%, which is normal. Head is normocephalic and atraumatic. PERRLA, EOMI. Oropharynx is clear. Neck is nontender and supple without adenopathy or JVD. Back is nontender and there is no CVA tenderness. Lungs are clear without rales, wheezes, or rhonchi. Chest is nontender. Heart has regular rate and rhythm without murmur. Abdomen is soft, flat, nontender without masses or hepatosplenomegaly and peristalsis is normoactive. Extremities: Laceration is noted on the radial aspect of the right second finger middle  phalanx.  Distal neurovascular exam is intact and tendon function is normal. Skin is warm and dry without rash. Neurologic: Mental status is normal, cranial nerves are intact, there are no motor or sensory deficits.  ED Results / Procedures / Treatments   Labs (all labs ordered are listed, but only abnormal results are displayed) Labs Reviewed - No data to display  EKG None  Radiology No results found.  Procedures .Marland KitchenLaceration Repair  Date/Time: 04/08/2020 3:52 AM Performed by: Dione Booze, MD Authorized by: Dione Booze, MD   Consent:    Consent obtained:  Verbal   Consent given by:  Patient   Risks, benefits, and alternatives were discussed: yes     Risks discussed:  Infection, pain, tendon damage, vascular damage and nerve damage   Alternatives discussed:  No treatment Universal protocol:    Procedure explained and questions answered to patient or proxy's satisfaction: yes     Relevant documents present and verified: yes     Test results available: yes     Required blood products, implants, devices, and special equipment available: yes     Site/side marked: yes     Immediately prior to procedure, a time out was called: yes     Patient identity confirmed:  Verbally with patient and arm band Anesthesia:    Anesthesia method:  Local infiltration   Local anesthetic:  Lidocaine 2% WITH epi Laceration details:    Location:  Finger   Finger location:  R index finger   Length (cm):  3   Depth (mm):  3 Pre-procedure details:    Preparation:  Patient was prepped and draped in usual sterile fashion Exploration:    Limited defect created (wound extended): no     Hemostasis achieved with:  Direct pressure   Wound exploration: entire depth of wound visualized     Wound extent: no foreign bodies/material noted, no nerve damage noted, no tendon damage noted, no underlying fracture noted and no vascular damage noted     Contaminated: no   Treatment:    Area cleansed with:   Saline   Amount of cleaning:  Standard   Debridement:  None   Undermining:  None   Scar revision: no   Skin repair:    Repair method:  Sutures   Suture size:  4-0   Suture material:  Prolene   Suture technique:  Simple interrupted   Number of sutures:  6 Approximation:    Approximation:  Close Repair type:    Repair type:  Simple Post-procedure details:    Dressing:  Antibiotic ointment and sterile dressing   Procedure completion:  Tolerated well, no immediate complications     Medications Ordered in ED Medications - No data to display  ED Course  I  have reviewed the triage vital signs and the nursing notes.  MDM Rules/Calculators/A&P Laceration of the right index finger closed with sutures.  Tdap booster is given.  Old records are reviewed, and she has no relevant past visits.  Final Clinical Impression(s) / ED Diagnoses Final diagnoses:  Laceration of right index finger, initial encounter    Rx / DC Orders ED Discharge Orders    None       Dione Booze, MD 04/08/20 [id number]

## 2020-04-08 NOTE — ED Triage Notes (Signed)
Pt c/o right finger laceration with a knife tonight.

## 2020-04-14 ENCOUNTER — Ambulatory Visit: Payer: Self-pay | Admitting: Physician Assistant

## 2020-04-14 VITALS — Temp 97.8°F

## 2020-04-14 DIAGNOSIS — Z4802 Encounter for removal of sutures: Secondary | ICD-10-CM

## 2020-04-14 DIAGNOSIS — Z789 Other specified health status: Secondary | ICD-10-CM

## 2020-04-14 NOTE — Progress Notes (Signed)
   Temp 97.8 F (36.6 C)    Subjective:    Patient ID: Eileen [name], female    DOB: June 13, 1980, 40 y.o.   MRN: [id number]  HPI: Eileen [name] is a 40 y.o. female presenting on 04/14/2020 for No chief complaint on file.   HPI    Pt had a negative covid 19 screening questionnaire.    Sutures place in ER on 04/08/20 after pt cut her finger while washing the dishes.  Her tdap was updated.  She has been cleaning the laceration with peroxide and applying neosporin.  She is here today for s/r.      Relevant past medical, surgical, family and social history reviewed and updated as indicated. Interim medical history since our last visit reviewed. Allergies and medications reviewed and updated.  Review of Systems  Per HPI unless specifically indicated above     Objective:    Temp 97.8 F (36.6 C)   Wt Readings from Last 3 Encounters:  04/08/20 235 lb (106.6 kg)  07/01/19 255 lb 6.4 oz (115.8 kg)  06/11/19 255 lb 3.2 oz (115.8 kg)    Physical Exam Constitutional:      General: She is not in acute distress.    Appearance: She is obese. She is not toxic-appearing.  HENT:     Head: Normocephalic and atraumatic.  Pulmonary:     Effort: Pulmonary effort is normal. No respiratory distress.  Musculoskeletal:     Right hand: Laceration and tenderness present. No swelling or deformity.     Comments: Mild soft tissue tenderness R index finger at site of laceration.  No pus or erythema.  Good ROM.  Neurological:     Mental Status: She is alert and oriented to person, place, and time.  Psychiatric:        Speech: Speech normal.        Behavior: Behavior normal. Behavior is cooperative.       S/r by PA       Assessment & Plan:     Encounter Diagnoses  Name Primary?  . Visit for suture removal Yes  . Non-English speaking patient      Pt is counseled on wound care, specifically to AVOID peroxide use.  She is to clean daily with soap and water.   Ointment and bandaid if desired.  She has routine follow up in may.  She is to contact office sooner prn

## 2020-04-21 ENCOUNTER — Ambulatory Visit: Payer: Self-pay | Admitting: Physician Assistant

## 2020-06-13 ENCOUNTER — Ambulatory Visit: Payer: Self-pay | Admitting: Physician Assistant

## 2020-06-13 ENCOUNTER — Other Ambulatory Visit: Payer: Self-pay

## 2020-06-13 ENCOUNTER — Encounter: Payer: Self-pay | Admitting: Physician Assistant

## 2020-06-13 VITALS — BP 110/67 | HR 85 | Temp 98.1°F | Wt 249.0 lb

## 2020-06-13 DIAGNOSIS — Z0000 Encounter for general adult medical examination without abnormal findings: Secondary | ICD-10-CM

## 2020-06-13 DIAGNOSIS — Z789 Other specified health status: Secondary | ICD-10-CM

## 2020-06-13 NOTE — Progress Notes (Signed)
   BP 110/67   Pulse 85   Temp 98.1 F (36.7 C)   Wt 249 lb (112.9 kg)   SpO2 97%   BMI 48.63 kg/m    Subjective:    Patient ID: Eileen [name], female    DOB: 1980/04/13, 40 y.o.   MRN: [id number]  HPI: Eileen [name] is a 40 y.o. female presenting on 06/13/2020 for Annual Exam   HPI     Pt had a negative covid 19 screening questionnaire    Pt had appointment in may with gyn for cyst and was on recall to follow up in November.  No appoinment was made, likely letter sent was in english that she couldn't read.    She is using condoms for contraception.  She got 1st 2 covid shots but doesn't have her card with her.  She has no complaints today.     Relevant past medical, surgical, family and social history reviewed and updated as indicated. Interim medical history since our last visit reviewed. Allergies and medications reviewed and updated.  CURRENT MEDS: none  Review of Systems  Per HPI unless specifically indicated above     Objective:    BP 110/67   Pulse 85   Temp 98.1 F (36.7 C)   Wt 249 lb (112.9 kg)   SpO2 97%   BMI 48.63 kg/m   Wt Readings from Last 3 Encounters:  06/13/20 249 lb (112.9 kg)  04/08/20 235 lb (106.6 kg)  07/01/19 255 lb 6.4 oz (115.8 kg)    Physical Exam Vitals reviewed.  Constitutional:      Appearance: She is well-developed.  HENT:     Head: Normocephalic and atraumatic.     Right Ear: Tympanic membrane and ear canal normal.     Left Ear: Tympanic membrane and ear canal normal.  Eyes:     Extraocular Movements: Extraocular movements intact.     Conjunctiva/sclera: Conjunctivae normal.     Pupils: Pupils are equal, round, and reactive to light.  Cardiovascular:     Rate and Rhythm: Normal rate and regular rhythm.  Pulmonary:     Effort: Pulmonary effort is normal.     Breath sounds: Normal breath sounds.  Abdominal:     General: Bowel sounds are normal.     Palpations: Abdomen is soft. There is no  mass.     Tenderness: There is no abdominal tenderness.  Musculoskeletal:     Cervical back: Neck supple.     Right lower leg: No edema.     Left lower leg: No edema.  Lymphadenopathy:     Cervical: No cervical adenopathy.  Skin:    General: Skin is warm and dry.  Neurological:     Mental Status: She is alert and oriented to person, place, and time.  Psychiatric:        Behavior: Behavior normal.               Assessment & Plan:    Encounter Diagnoses  Name Primary?  . Visit for annual health examination Yes  . Non-English speaking patient       -Pt counseled to call gyn to schedule follow up appointment -pt is given cafa / application for [employer] financial assistance -will Check lipids.  Pt Will be called with results -pt to follow up one year.  She is to contact office sooner prn

## 2020-06-13 NOTE — Patient Instructions (Addendum)
Gynecologist    Phone: [phone]   ---------------------------------------------     Obesidad en los adultos Obesity, Adult La obesidad es una afeccin que implica tener demasiada grasa corporal total. Tener sobrepeso u obesidad significa que el peso es mayor que lo que se considera saludable para el Biochemist, clinical. La obesidad se determina por Roxanne Gates llamada [facility]. El IMC (ndice de masa corporal) es la estimacin de la grasa corporal y se calcula a partir de la altura y [place]. Si un adulto tiene un [facility] de 30 o superior se considera obeso. La obesidad puede conducir a algunos de los siguientes problemas de salud y [place] graves:  Accidente cerebrovascular.  Arteriopata coronaria (EAC).  Diabetes tipo 2.  Algunos tipos de cncer, incluido el cncer de colon, mama, tero y vescula.  Artrosis.  Presin arterial alta (hipertensin arterial).  Colesterol alto.  Apnea del sueo.  Clculos en la vescula.  Problemas de esterilidad. Cules son las causas? Las causas ms frecuentes de esta afeccin [employer] siguientes:  Consumir diariamente alimentos con altos niveles de caloras, azcar y [place]  Nacer con genes que pueden hacerlo ms propenso a ser obeso.  Tener una afeccin que causa obesidad, por ejemplo: ? Hipotiroidismo. ? Sndrome del ovario poliqustico (SOP). ? Trastorno alimentario compulsivo. ? Sndrome de [place].  Tomar ciertos medicamentos, como esteroides, antidepresivos y [place].  No ser fsicamente activo (estilo de vida sedentario).  No dormir lo suficiente.  Beber grandes cantidades de bebidas endulzadas con azcar, como refrescos. Qu incrementa el riesgo? Los siguientes factores pueden hacer que sea ms propenso a contraer esta afeccin:  Tener antecedentes familiares de obesidad.  Ser una mujer de origen afroamericano.  Ser un hombre de origen hispano.  Vivir en un rea con acceso limitado a las siguientes  posibilidades: ? Parques, centros recreativos o veredas. ? Alimentos saludables, como se venden en tiendas de comestibles y mercados de Event organiser. Cules son los signos o los sntomas? El principal signo de esta afeccin es tener demasiada grasa corporal. Cmo se diagnostica? Esta afeccin se diagnostica en funcin de lo siguiente:  Su IMC. Si usted es un adulto y su IMC es de 30 o ms, se considera que es obeso.  La circunferencia de la cintura. Es Neomia Dear medicin alrededor de Lobbyist.  El grosor del pliegue cutneo. El mdico puede pellizcar suavemente un pliegue de la piel y [place]. Es posible que le hagan otros estudios para ver si hay afecciones subyacentes. Cmo se trata? El tratamiento de esta afeccin frecuentemente incluye cambiar el estilo de vida. El tratamiento puede incluir algunos o todos los siguientes elementos:  Cambios en la dieta. Esto puede incluir el desarrollo de un plan de alimentacin saludable.  Realizar actividad fsica con regularidad. Puede incluir una actividad que hace que el corazn lata ms rpido (ejercicio [place]) y Fish farm manager de Pensions consultant. Trabajar con el mdico para disear un programa de ejercicios que sea adecuado para usted.  Medicamentos para ayudarle a bajar de peso si no puede perder 1 libra (450g) por semana despus de 6 semanas de comer saludablemente y de hacer ms actividad fsica.  Tratar las afecciones que causan la obesidad (afecciones preexistentes).  Ciruga. Las opciones quirrgicas pueden incluir bandas gstricas y bypass gstrico. Se puede realizar una ciruga si: ? Otros tratamientos no mejoraron su afeccin. ? Tiene un IMC de 40 o superior. ? Tiene problemas de salud potencialmente mortales relacionados con la obesidad. Siga estas instrucciones en su casa: Comida y bebida  Siga las  instrucciones del mdico respecto de lo que puede comer o beber. El mdico puede indicarle que haga lo siguiente: ? Limitar las comidas  rpidas, los dulces y las colaciones procesadas. ? Elegir opciones con bajo contenido de Eugenio Saenz, como leche descremada en lugar de Hilda entera. ? Consumir 5o ms porciones de frutas o verduras por da. ? Comer en casa con ms frecuencia. Esto le da ms control sobre lo que come. ? Cuando coma afuera, elija alimentos saludables. ? Aprenda a leer las etiquetas de los alimentos. Esto le ayudar a entender cunta comida se considera una porcin. ? Aprenda cul es el tamao de una porcin saludable. ? Tenga a mano colaciones con bajo contenido de grasas. ? Limite las bebidas azucaradas, como refrescos, jugo de frutas, t helado endulzado y [place] saborizada.  Beba suficiente agua para mantener la orina de color amarillo plido.  No siga una dieta de [place]. Las dietas de moda pueden ser poco saludables e incluso peligrosas.   Actividad fsica  Realice ejercicio con regularidad como se lo haya indicado el mdico. ? La [employer] de los adultos deben hacer hasta de ejercicio de intensidad moderada cada semana. ? Consulte al mdico qu tipo de ejercicios es seguro para usted y con qu frecuencia debe ejercitarse.  Precaliente y elongue adecuadamente antes de hacer actividad fsica.  Reljese y elongue despus de hacer actividad fsica.  Descanse entre los perodos de [place]. Estilo de vida  Trabaje con el mdico y un nutricionista para Ship broker una meta de prdida de peso que sea saludable y razonable para usted.  Limite el tiempo que pasa frente a una pantalla.  Busque formas de recompensarse que no incluyan alimentos.  No beba alcohol si: ? Su mdico le indica no hacerlo. ? Est embarazada, puede estar embarazada o est tratando de quedar embarazada.  Si bebe alcohol: ? Limite la cantidad que bebe a lo siguiente:  De 0 a 1 medida por da para las mujeres.  De 0 a 2 medidas por da para los hombres. ? Est atento a la cantidad de alcohol que hay en las bebidas que toma.  En los [place], una medida equivale a una botella de cerveza de 12oz ( ), un vaso de vino de 5oz ( ) o un vaso de una bebida alcohlica de alta graduacin de 1oz (60ml). Instrucciones generales  Registre la prdida de peso en un diario para Education officer, environmental un seguimiento de los alimentos que consume y cunto se Pension scheme manager.  Tome los medicamentos de venta libre y los recetados solamente como se lo haya indicado el mdico.  Tome vitaminas y suplementos solamente como se lo haya indicado el mdico.  Considere la posibilidad de Advertising account planner en un grupo de apoyo. El mdico podra recomendarle un grupo de apoyo.  Concurra a todas las visitas de [address] se lo haya indicado el mdico. Esto es importante. Comunquese con un mdico si:  No puede alcanzar su objetivo de prdida de peso despus de 6 semanas de cambios en la dieta y en el estilo de vida. Solicite ayuda inmediatamente si tiene:  Dificultad para respirar.  Pensamientos o conductas suicidas. Resumen  La obesidad es una afeccin que implica tener demasiada grasa corporal total.  Tener sobrepeso u obesidad significa que el peso es mayor que lo que se considera saludable para el tamao corporal.  Jamey Reas con el mdico y un nutricionista para establecer una meta de prdida de peso que sea saludable y razonable para usted.  Realice ejercicio con regularidad como se  lo haya indicado el mdico. Consulte al mdico qu tipo de ejercicios es seguro para usted y con qu frecuencia debe ejercitarse. Esta informacin no tiene Theme park manager el consejo del mdico. Asegrese de hacerle al mdico cualquier pregunta que tenga. Document Revised: 10/31/2017 Document Reviewed: 10/31/2017 Elsevier Patient Education  2021 [employer].

## 2020-06-16 ENCOUNTER — Other Ambulatory Visit: Payer: Self-pay | Admitting: Physician Assistant

## 2020-06-16 ENCOUNTER — Other Ambulatory Visit: Payer: Self-pay

## 2020-06-16 ENCOUNTER — Other Ambulatory Visit (HOSPITAL_COMMUNITY)
Admission: RE | Admit: 2020-06-16 | Discharge: 2020-06-16 | Disposition: A | Discharge status: Home / Self Care | Payer: Self-pay | Source: Ambulatory Visit | Attending: Physician Assistant | Admitting: Physician Assistant

## 2020-06-16 DIAGNOSIS — Z13220 Encounter for screening for lipoid disorders: Secondary | ICD-10-CM

## 2020-06-16 LAB — COMPREHENSIVE METABOLIC PANEL
ALT: 26 U/L (ref 0–44)
AST: 19 U/L (ref 15–41)
Albumin: 4.1 g/dL (ref 3.5–5.0)
Alkaline Phosphatase: 63 U/L (ref 38–126)
Anion gap: 7 (ref 5–15)
BUN: 15 mg/dL (ref 6–20)
CO2: 26 mmol/L (ref 22–32)
Calcium: 9.5 mg/dL (ref 8.9–10.3)
Chloride: 103 mmol/L (ref 98–111)
Creatinine, Ser: 0.61 mg/dL (ref 0.44–1.00)
GFR, Estimated: >60 mL/min (ref >60)
Glucose, Bld: 101 mg/dL — ABNORMAL HIGH (ref 70–99)
Potassium: 3.9 mmol/L (ref 3.5–5.1)
Sodium: 136 mmol/L (ref 135–145)
Total Bilirubin: 0.8 mg/dL (ref 0.3–1.2)
Total Protein: 7.4 g/dL (ref 6.5–8.1)

## 2020-06-16 LAB — LIPID PANEL
Cholesterol: 193 mg/dL (ref 0–200)
HDL: 52 mg/dL (ref >40)
LDL Cholesterol: 116 mg/dL — ABNORMAL HIGH (ref 0–99)
Total CHOL/HDL Ratio: 3.7 RATIO
Triglycerides: 126 mg/dL (ref <150)
VLDL: 25 mg/dL (ref 0–40)

## 2020-08-29 ENCOUNTER — Ambulatory Visit: Payer: Self-pay | Admitting: Physician Assistant

## 2020-08-29 ENCOUNTER — Other Ambulatory Visit: Payer: Self-pay

## 2020-08-29 ENCOUNTER — Encounter: Payer: Self-pay | Admitting: Physician Assistant

## 2020-08-29 VITALS — BP 122/63 | HR 88 | Temp 97.9°F | Wt 271.0 lb

## 2020-08-29 DIAGNOSIS — Z789 Other specified health status: Secondary | ICD-10-CM

## 2020-08-29 DIAGNOSIS — L304 Erythema intertrigo: Secondary | ICD-10-CM

## 2020-08-29 MED ORDER — MICONAZOLE NITRATE 2 % EX CREA: 1.0000 "application " | TOPICAL_CREAM | Freq: Two times a day (BID) | CUTANEOUS | 0 refills | Status: DC

## 2020-08-29 NOTE — Progress Notes (Signed)
   BP 122/63   Pulse 88   Temp 97.9 F (36.6 C)   Wt 271 lb (122.9 kg)   SpO2 97%   BMI 52.93 kg/m    Subjective:    Patient ID: Eileen [name], female    DOB: June 09, 1980, 40 y.o.   MRN: [id number]  HPI: Eileen [name] is a 40 y.o. female presenting on 08/29/2020 for No chief complaint on file.   HPI   Pt had a negative covid 19 screening questionnaire.   Pt complains of  Pimples armpit  bilaterally which are getting  worse.   It is Itchy.     No changes in deodorant or body soap.   She did have recent change in laugndry soap  She has had symptoms for  2weeks.    Relevant past medical, surgical, family and social history reviewed and updated as indicated. Interim medical history since our last visit reviewed. Allergies and medications reviewed and updated.   CURRENT MEDS: None   Review of Systems  Per HPI unless specifically indicated above     Objective:    BP 122/63   Pulse 88   Temp 97.9 F (36.6 C)   Wt 271 lb (122.9 kg)   SpO2 97%   BMI 52.93 kg/m   Wt Readings from Last 3 Encounters:  08/29/20 271 lb (122.9 kg)  06/13/20 249 lb (112.9 kg)  04/08/20 235 lb (106.6 kg)    Physical Exam Constitutional:      General: She is not in acute distress.    Appearance: She is obese. She is not ill-appearing.  HENT:     Head: Normocephalic and atraumatic.  Pulmonary:     Effort: No respiratory distress.  Skin:    Comments: Skin R axilla red thickened with early breakdown.  L axilla just mildly irritated  Neurological:     Mental Status: She is alert and oriented to person, place, and time.  Psychiatric:        Behavior: Behavior normal.          Assessment & Plan:   Encounter Diagnoses  Name Primary?   Intertrigo Yes   Non-English speaking patient       -rx miconazole.  Pt was counseled on avoiding shaving, deodorant and to keep area dry in order to improve the skin.  Discussed long term and weight as contributing factor.   -pt to follow up as scheduled.  Contact office sooner prn

## 2021-06-13 ENCOUNTER — Ambulatory Visit: Payer: Self-pay | Admitting: Physician Assistant

## 2021-06-19 ENCOUNTER — Encounter: Payer: Self-pay | Admitting: Physician Assistant

## 2021-06-19 ENCOUNTER — Ambulatory Visit: Payer: Self-pay | Admitting: Physician Assistant

## 2021-06-19 VITALS — BP 118/68 | HR 89 | Temp 97.6°F | Wt 267.0 lb

## 2021-06-19 DIAGNOSIS — Z1239 Encounter for other screening for malignant neoplasm of breast: Secondary | ICD-10-CM

## 2021-06-19 DIAGNOSIS — Z0000 Encounter for general adult medical examination without abnormal findings: Secondary | ICD-10-CM

## 2021-06-19 DIAGNOSIS — Z789 Other specified health status: Secondary | ICD-10-CM

## 2021-06-19 DIAGNOSIS — R32 Unspecified urinary incontinence: Secondary | ICD-10-CM

## 2021-06-19 LAB — POCT URINALYSIS DIPSTICK
Bilirubin, UA: NEGATIVE
Blood, UA: NEGATIVE
Glucose, UA: NEGATIVE
Ketones, UA: NEGATIVE
Leukocytes, UA: NEGATIVE
Nitrite, UA: NEGATIVE
Protein, UA: NEGATIVE
Spec Grav, UA: >=1.03 — AB (ref 1.010–1.025)
Urobilinogen, UA: 0.2 E.U./dL
pH, UA: 5.5 (ref 5.0–8.0)

## 2021-06-19 NOTE — Progress Notes (Signed)
? ?BP 118/68   Pulse 89   Temp 97.6 ?F (36.4 ?C)   Wt 267 lb (121.1 kg)   SpO2 96%   BMI 52.14 kg/m?   ? ?Subjective:  ? ? Patient ID: Eileen [name], female    DOB: 04/22/1980, 41 y.o.   MRN: [id number] ? ?HPI: ?Eileen [name] is a 41 y.o. female presenting on 06/19/2021 for Annual Exam ? ? ?HPI ? ? ? ? ? ?Chief Complaint  ?Patient presents with  ? Annual Exam  ? ? ? ?She is leaking a little bit of urine when she sneezes or carries something heavy.   This started about 3 years ago after she had a baby.   ? ? ?She is using nothing for contraception ? 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 ? ?She got covid vaccination and booster ? ? ? ?Relevant past medical, surgical, family and social history reviewed and updated as indicated. Interim medical history since our last visit reviewed. ?Allergies and medications reviewed and updated. ? ? ?No current outpatient medications on file. ? ? ? ?Review of Systems ? ?Per HPI unless specifically indicated above ? ?   ?Objective:  ?  ?BP 118/68   Pulse 89   Temp 97.6 ?F (36.4 ?C)   Wt 267 lb (121.1 kg)   SpO2 96%   BMI 52.14 kg/m?   ?Wt Readings from Last 3 Encounters:  ?06/19/21 267 lb (121.1 kg)  ?08/29/20 271 lb (122.9 kg)  ?06/13/20 249 lb (112.9 kg)  ?  ?Physical Exam ?Vitals reviewed.  ?Constitutional:   ?   General: She is not in acute distress. ?   Appearance: She is well-developed. She is obese. She is not toxic-appearing.  ?HENT:  ?   Head: Normocephalic and atraumatic.  ?   Right Ear: Tympanic membrane, ear canal and external ear normal.  ?   Left Ear: Tympanic membrane, ear canal and external ear normal.  ?Eyes:  ?   Extraocular Movements: Extraocular movements intact.  ?   Conjunctiva/sclera: Conjunctivae normal.  ?   Pupils: Pupils are equal, round, and reactive to light.  ?Neck:  ?   Thyroid: No thyromegaly.  ?Cardiovascular:  ?   Rate and Rhythm: Normal rate and regular rhythm.  ?Pulmonary:  ?   Effort: Pulmonary effort is normal.  ?    Breath sounds: Normal breath sounds.  ?Abdominal:  ?   General: Bowel sounds are normal.  ?   Palpations: Abdomen is soft. There is no mass.  ?   Tenderness: There is no abdominal tenderness.  ?Musculoskeletal:  ?   Cervical back: Neck supple.  ?   Right lower leg: No edema.  ?   Left lower leg: No edema.  ?Lymphadenopathy:  ?   Cervical: No cervical adenopathy.  ?Skin: ?   General: Skin is warm and dry.  ?Neurological:  ?   Mental Status: She is alert and oriented to person, place, and time.  ?   Gait: Gait normal.  ?Psychiatric:     ?   Behavior: Behavior normal.  ? ? ? ?UA unremarkable ? ? ?   ?Assessment & Plan:  ? ? ?Encounter Diagnoses  ?Name Primary?  ? Visit for annual health examination Yes  ? Incontinence of urine in female   ? Non-English speaking patient   ? Morbid obesity ([place])   ? Encounter for screening for malignant neoplasm of breast, unspecified screening modality   ? ? ? ?-Encouraged condom use during intercourse to prevent pregnancy ?-  Refer for screening mammogram ?-No labs needed at this time ?-Discussed medication to help incontinence but Pt declined medication.  Encouraged weight loss and kegel exercises.  She was given reading information on these ?-pt to follow up 1 year. She is to contact office sooner prn ? ?

## 2021-06-19 NOTE — Patient Instructions (Signed)
Incontinencia urinaria ?Urinary Incontinence ?La incontinencia urinaria es una afecci?n en la cual una persona no puede controlar d?nde y cu?ndo Geographical information systems officerorinar. Neomia DearUna persona con esta afecci?n orinar? involuntariamente. Esto significa que la persona orina cuando no tiene intenci?n de Thermalhacerlo. ??Cu?les son las causas? ?Esta afecci?n puede ser causada por lo siguiente: ?Medicamentos. ?Infecciones. ?Estre?imiento. ?M?sculos de la vejiga hiperactivos. ?M?sculos de la vejiga d?biles. ?M?sculos del suelo de la pelvis d?biles. Estos m?sculos proporcionan apoyo a la vejiga, el intestino y el ?tero, en el caso de las [place] Creekmujeres. ?Aumento del tama?o de la pr?stata en los hombres. La pr?stata es una gl?ndula cerca de la vejiga. Cuando se Kenyaagranda mucho, puede comprimir la Freeporturetra. Si la uretra est? obstruida, la vejiga puede debilitarse y perder la capacidad para vaciarse correctamente. ?Cirug?a. ?Factores emocionales, como la Bald Knobansiedad, el estr?s o el trastorno de estr?s postraum?tico (TEP). ?Lesi?n en la m?dula espinal, lesi?n nerviosa u otras afecciones neurol?gicas. ?Prolapso de los ?rganos de la pelvis. Esto ocurre en las [place]mujeres, Deshacuando los ?rganos se salen de lugar, hacia la vagina. Este movimiento puede evitar que la vejiga y la uretra funcionen correctamente. ??Qu? incrementa el riesgo? ?Los siguientes factores pueden hacer que sea m?s propenso a Aeronautical engineerdesarrollar esta afecci?n: ?Edad. Cuanto mayor es, mayor es Nurse, adultel riesgo. ?Obesidad. ?Estar f?sicamente inactivo. ?Embarazo y Myanmarparto. ?Menopausia. ?Enfermedades que Ameren Corporationafectan los nervios o la m?dula espinal. ?Tos de larga duraci?n o cr?nica. Esto puede aumentar la presi?n en los m?sculos de la vejiga y del suelo p?lvico. ??Cu?les son los signos o s?ntomas? ?Los s?ntomas pueden variar, seg?n el tipo de incontinencia urinaria que tenga. Entre ellos, se incluyen los siguientes: ?Una necesidad repentina de Geographical information systems officerorinar, y orinar involuntariamente antes de llegar al ba?o (incontinencia imperiosa). ?Orinar  repentinamente al realizar actividades que provocan la micci?n, como toser, re?r, Radio producerhacer ejercicio o estornudar (incontinencia por esfuerzo). ?Tener ganas de orinar con frecuencia, Medical laboratory scientific officerpero orinar en peque?as cantidades o gotear orina constantemente (incontinencia por rebosamiento). ?Orinar porque no puede llegar al ba?o a tiempo a causa de una discapacidad f?sica, como la artritis o una lesi?n, o debido a afecciones comunicativas o cognitivas, como la enfermedad de Alzheimer (incontinencia funcional). ??C?mo se diagnostica? ?Esta afecci?n se puede diagnosticar en funci?n de lo siguiente: ?Sus antecedentes m?dicos. ?Un examen f?sico. ?Pruebas, como, por ejemplo: ?An?lisis de Comorosorina. ?Radiograf?as del ri??n y de la vejiga. ?Ecograf?a. ?Exploraci?n por tomograf?a computarizada (TC). ?Cistoscopia. En este procedimiento, el m?dico inserta un tubo con Neomia Dearuna luz y Neomia Dearuna c?mara (cistoscopio) por la uretra hacia la vejiga para Stage managerdetectar problemas. ?Estudios urodin?micos. Estos estudios eval?an la capacidad de la vejiga, de la uretra y del esf?nter para Academic librarianalmacenar y Equities traderdescargar orina. Hay distintos tipos de estudios urodin?micos, que pueden variar seg?n lo que determinar? Biomedical engineercada estudio. ?Para ayudar a diagnosticar la afecci?n, el m?dico puede recomendarle que mantenga un registro de la frecuencia con la que Comorosorina y de la cantidad de [place]orina. ??C?mo se trata? ?El tratamiento de esta afecci?n depende del tipo de incontinencia que tenga y de su causa. El tratamiento puede incluir: ?Cambios en el estilo de vida, como por ejemplo: ?Dejar de fumar. ?Mantener un peso saludable. ?Mantenerse activo. Trate de hacer 150 minutos de ejercicio de intensidad moderada cada semana. Preg?ntele al m?dico qu? actividades son seguras para usted. ?Seguir una dieta saludable. ?Evite los alimentos con alto contenido de grasa, como los alimentos fritos. ?Reduzca el consumo de carbohidratos refinados, como el pan blanco y el arroz blanco. ?Limite la cantidad de  cafe?na que consume. ?Aumente el consumo de [place]fibra.  Las fuentes saludables de fibra incluyen los frijoles, los cereales integrales y las frutas y verduras frescas. ?Cambios en el comportamiento, por ejemplo: ?Ejercicios para el m?sculo del piso p?lvico. ?Ejercitaci?n de la vejiga, Air cabin crew la cantidad de [employer] idas al ba?o o ir al ba?o en intervalos regulares. ?Aplicar t?cnicas para reducir las urgencias de la vejiga. Esto puede incluir t?cnicas de distracci?n o ejercicios de respiraci?n controlada. ?Medicamentos, como por ejemplo: ?Medicamentos para International aid/development worker los m?sculos de la vejiga y Automotive engineer los espasmos de la vejiga. ?Medicamentos para disminuir o Regulatory affairs officer de la pr?stata, en el caso de los hombres. ?Inyecciones de b?tox. Esto puede ayudar a International aid/development worker los m?sculos de la vejiga. ?Tratamientos como los siguientes: ?Usar pulsos el?ctricos para Media planner los reflejos de la vejiga (estimulaci?n nerviosa el?ctrica). ?En el caso de las mujeres, Chemical engineer un dispositivo m?dico para evitar p?rdidas de [place]. Este es un dispositivo peque?o y desechable, parecido a un tamp?n, que se inserta en la uretra. ?Inyectar col?geno o perlas de carbono (espesantes) en el esf?nter urinario. Esto puede ayudar a espesar el tejido y cerrar la abertura de la vejiga. ?Cirug?a. ?Siga estas instrucciones en su casa: ?Estilo de vida ?Limite el consumo de alcohol y cafe?na. Estos pueden llenar la vejiga r?pidamente e irritarla. ?Mantenga su higiene personal para evitar malos olores y lesiones cut?neas. Preg?ntele al m?dico acerca de cremas y productos para la piel que puedan proteger la piel de la [place]. ?Considere usar compresas o pa?ales para adultos. Aseg?rese de cambiarlos con regularidad y siempre c?mbielos tras un episodio de incontinencia. ?Instrucciones generales ?Use los medicamentos de venta libre y los recetados solamente como se lo haya indicado el m?dico. ?Trate de ir al ba?o cada 3 o 4 horas, aunque no  sienta la necesidad de Geographical information systems officer. T?mese el tiempo para vaciar la vejiga completamente cada vez que orine. Despu?s de orinar, espere un minuto. Luego trate de orinar nuevamente. ?Adopte una posici?n c?moda para orinar. ?Si la causa de su incontinencia son problemas nerviosos, lleve un registro de los medicamentos que toma y de las veces que vaya al ba?o. ?Cumpla con todas las visitas de seguimiento. Esto es importante. ?D?nde buscar m?s informaci?n ?[employer] of Diabetes and Digestive and Kidney Diseases [employer] de la Diabetes y las Enfermedades Digestivas y Renales): CarFlippers.tn ?American Urology Association (Asociaci?n Estadounidense de Urolog?a): www.urologyhealth.org ?Comun?quese con un m?dico si: ?Su dolor empeora. ?La incontinencia empeora. ?Solicite ayuda de inmediato si: ?Tiene fiebre o escalofr?os. ?No puede orinar. ?Tiene enrojecimiento en la zona de la ingle o en las piernas. ?Resumen ?La incontinencia urinaria es una afecci?n en la cual una persona no puede controlar d?nde y cu?ndo Geographical information systems officer. ?Esta afecci?n puede ser causada por medicamentos, infecciones, debilidad en los m?sculos de la vejiga, debilidad en los m?sculos del suelo p?lvico, agrandamiento de la pr?stata (en los hombres) o una cirug?a. ?Factores como la vejez, la obesidad, Firefighter y el parto, la menopausia, las enfermedades neurol?gicas y la tos cr?nica pueden aumentar el riesgo de tener esta afecci?n. ?Los tipos de incontinencia urinaria son la incontinencia imperiosa, la incontinencia por esfuerzo, la incontinencia por rebosamiento y la incontinencia funcional. ?Por lo general, esta afecci?n se trata primero con cambios en el estilo de vida y el comportamiento, como dejar de fumar, adoptar una alimentaci?n saludable y Radio producer ejercicios para el suelo p?lvico con regularidad. Otras opciones de tratamiento incluyen medicamentos, espesantes, dispositivos m?dicos, neuroestimulaci?n el?ctrica o cirug?a. ?Esta informaci?n  no tiene Theme park manager el consejo del m?dico. Aseg?rese de  hacerle al m?dico cualquier pregunta que tenga. ?Document Revised: 07/25/2020 Document Reviewed: 07/25/2020 ?Elsevier Patient Education ? 2023 Karolee Ohs

## 2021-07-06 ENCOUNTER — Other Ambulatory Visit: Payer: Self-pay | Admitting: Physician Assistant

## 2021-07-06 DIAGNOSIS — Z1231 Encounter for screening mammogram for malignant neoplasm of breast: Secondary | ICD-10-CM

## 2021-08-09 ENCOUNTER — Ambulatory Visit
Admission: RE | Admit: 2021-08-09 | Discharge: 2021-08-09 | Disposition: A | Discharge status: Home / Self Care | Payer: Self-pay | Source: Ambulatory Visit | Attending: Physician Assistant | Admitting: Physician Assistant

## 2021-08-09 DIAGNOSIS — Z1231 Encounter for screening mammogram for malignant neoplasm of breast: Secondary | ICD-10-CM

## 2022-03-12 IMAGING — CT CT RENAL STONE PROTOCOL
2 of 3 series · 16 of 46 positions shown, 18 images · non-contrast
Comparison: May 01, 2016 and May 27, 2004

CLINICAL DATA: Right flank pain today with hematuria.

EXAM:
CT ABDOMEN AND PELVIS WITHOUT CONTRAST
TECHNIQUE: Multidetector CT imaging of the abdomen and pelvis was performed
following the standard protocol without IV contrast. Automatic
exposure control utilized.

[Series 2: axial st · axial · 0.77mm/px · z∈[-472,-67]mm · 13 of 94 slices shown, 15 images]
[im 7/94  soft-tissue]
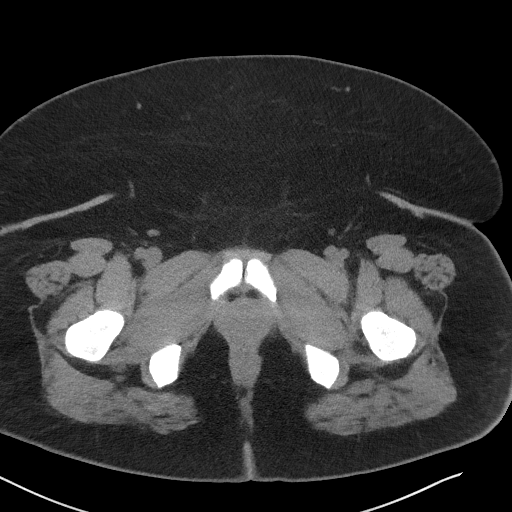
[im 7/94  bone]
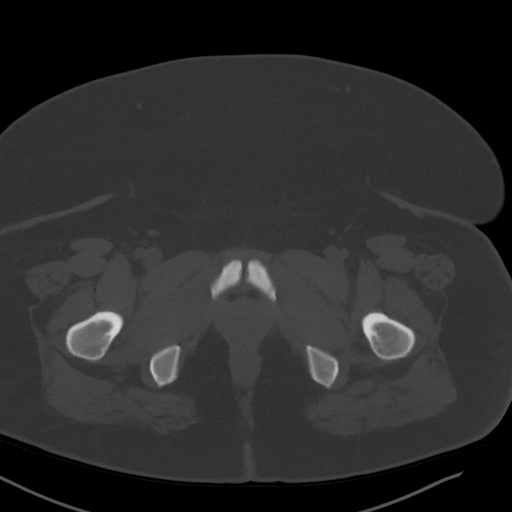
[im 13/94  soft-tissue]
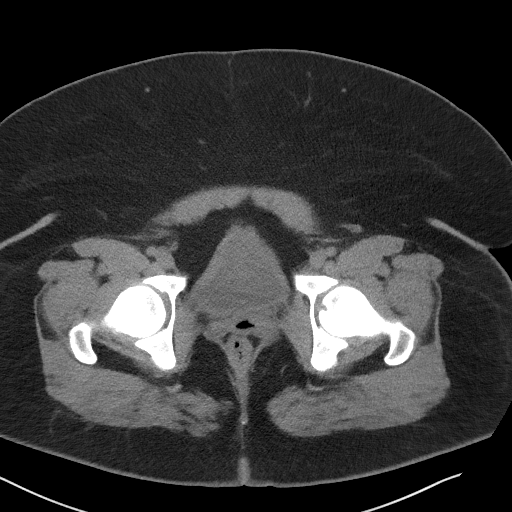
[im 19/94  soft-tissue]
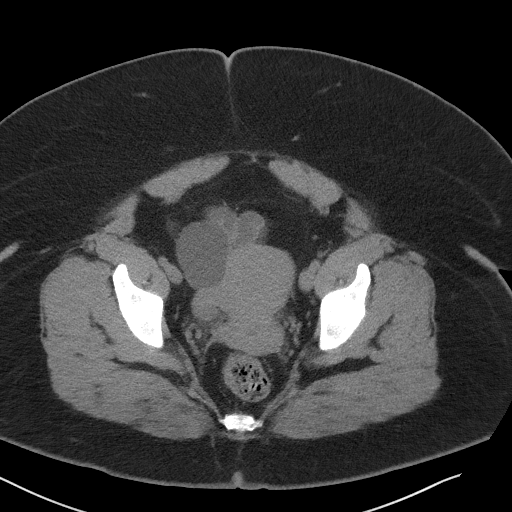
[im 28/94  soft-tissue]
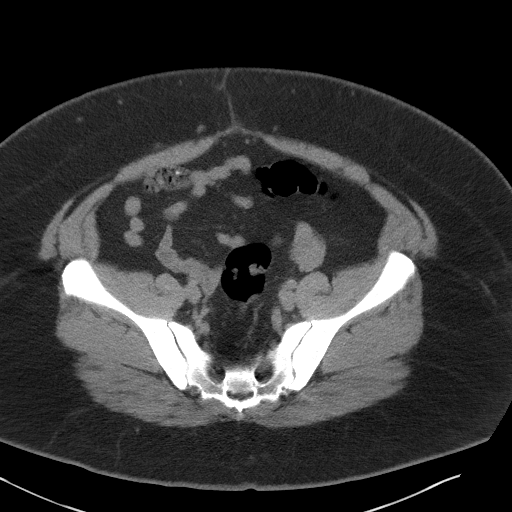
[im 34/94  soft-tissue]
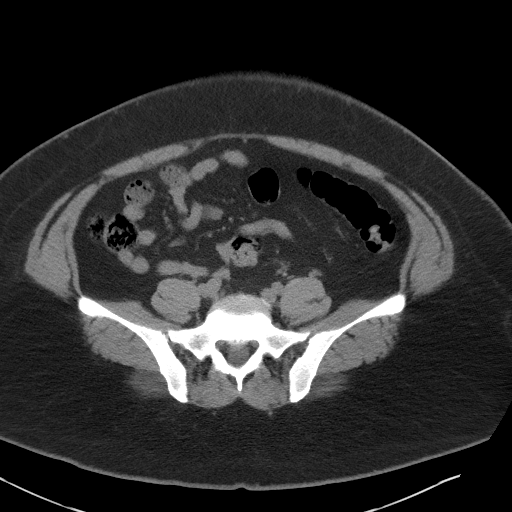
[im 40/94  soft-tissue]
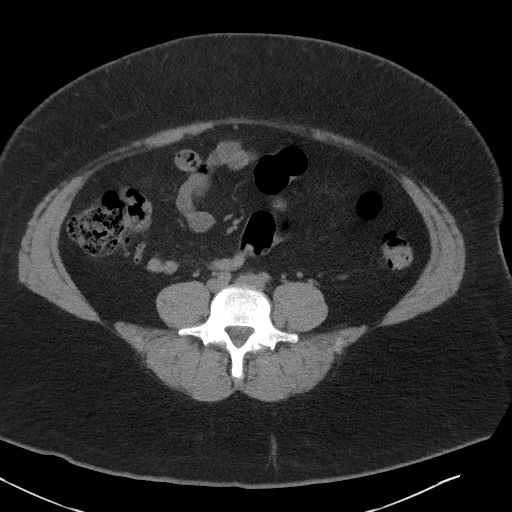
[im 49/94  soft-tissue]
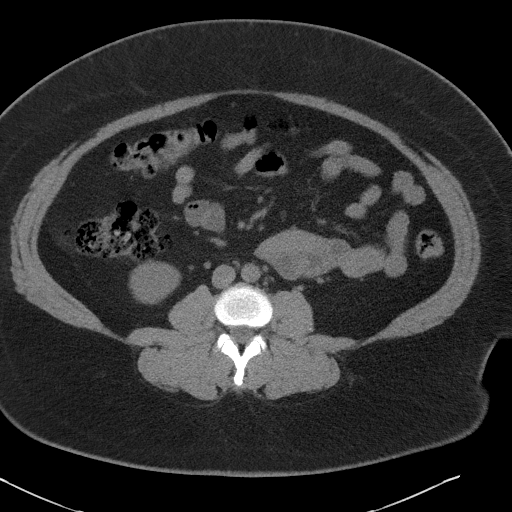
[im 55/94  soft-tissue]
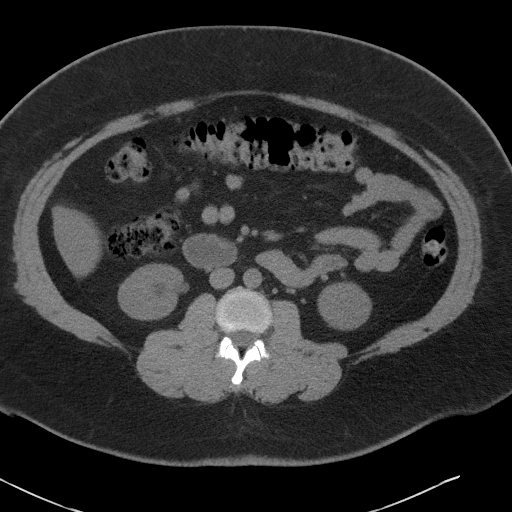
[im 61/94  soft-tissue]
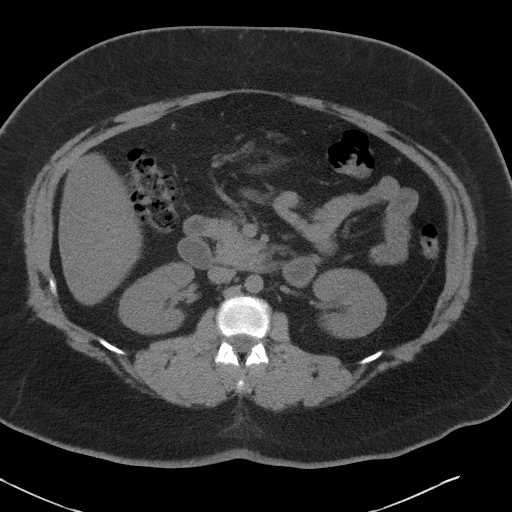
[im 61/94  bone]
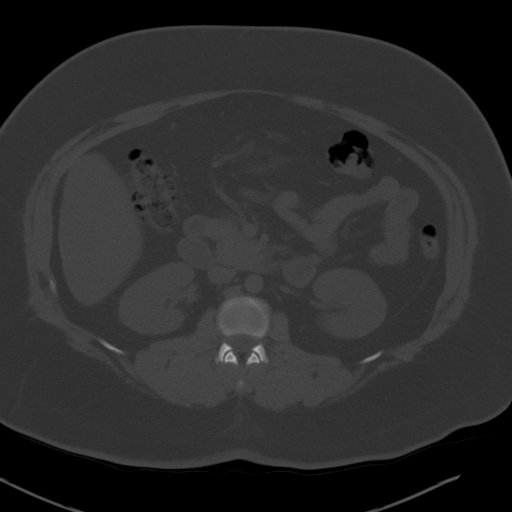
[im 67/94  soft-tissue]
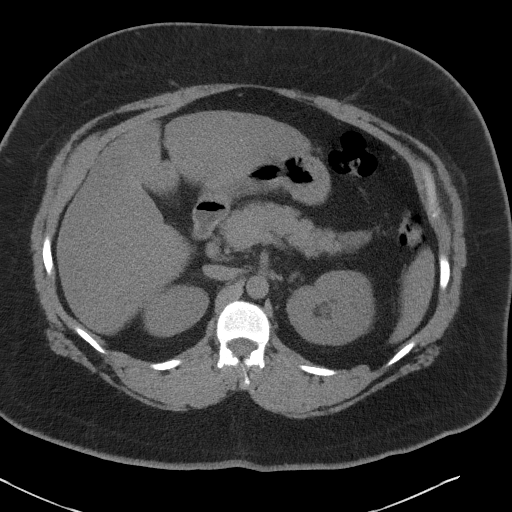
[im 76/94  soft-tissue]
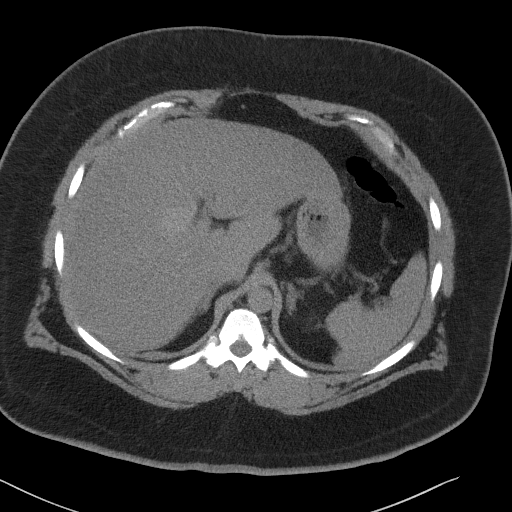
[im 82/94  soft-tissue]
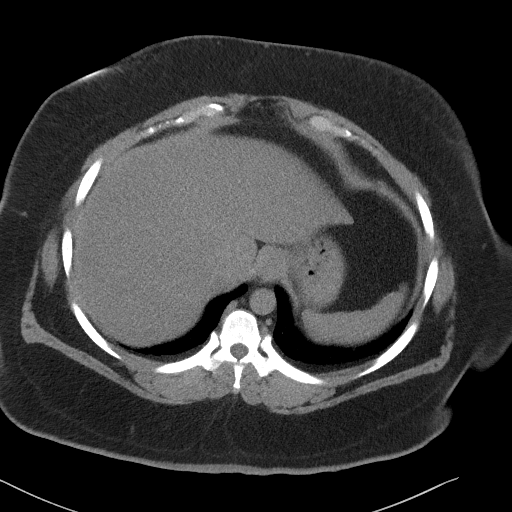
[im 88/94  soft-tissue]
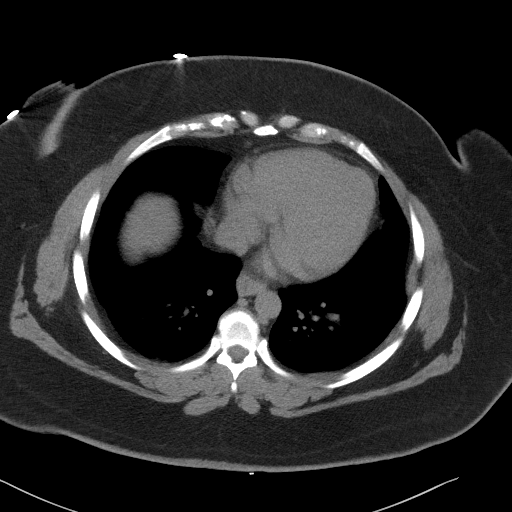

[Series 5: coronal st · coronal · 0.79mm/px · 3 of 101 slices shown]
[im 34/101  soft-tissue]
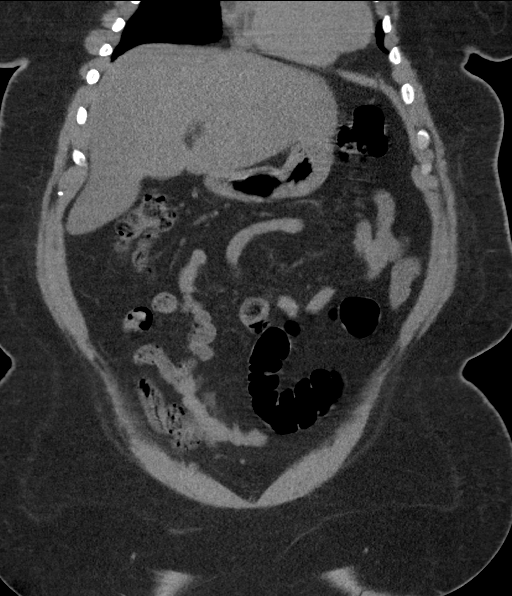
[im 45/101  soft-tissue]
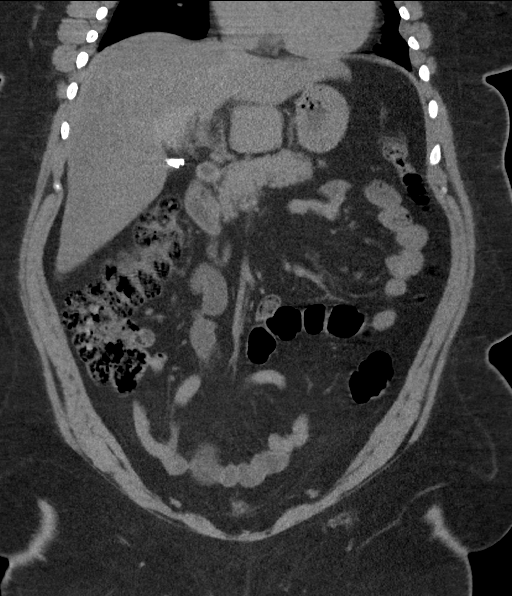
[im 56/101  soft-tissue]
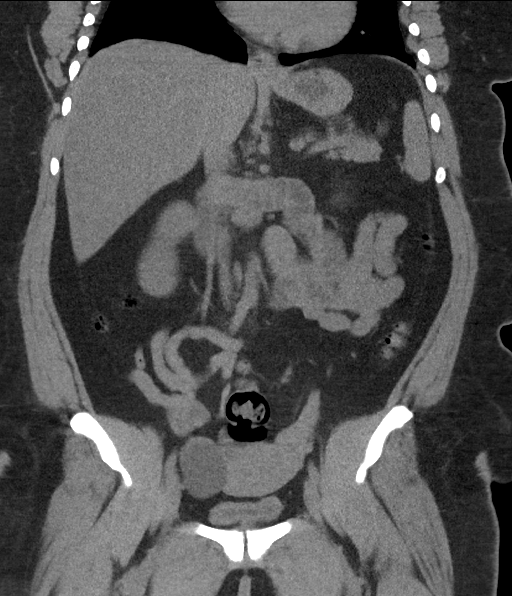

[16 of 46 positions shown; findings below may reference images not displayed]

FINDINGS: Lower chest: Small hiatal hernia. Mild cardiomegaly with central
vascular congestion. Benign subpleural fat deposition. Nonspecific
ground-glass opacities in the dependent portions of both lungs,
probably atelectatic.

Hepatobiliary: Mild hepatomegaly with fatty infiltration and a
probable focal fatty sparing in the gallbladder fossa.
Cholecystectomy. Normal biliary tree.

Pancreas: Normal.

Spleen: Normal.

Adrenals/Urinary Tract: Normal bilateral adrenal glands, kidneys and
ureters. Subtle perivesicular inflammatory change and mild diffuse
wall thickening with an intraluminal 10 mm soft tissue density that
is not contiguous with the bladder walls, probably representing a
small hemorrhagic clot given the history of hematuria.

Stomach/Bowel: Normal appendix (series 5, image 52). Moderate stool
burden in the proximal colon. No obstruction or apparent mucosal
thickening.

Vascular/Lymphatic: Congenital mild mass effect of the right common
iliac artery on the left common iliac vein. No abdominal aorta
calcified atherosclerosis or aneurysmal dilatation. Benign-sized
lymph nodes.

Reproductive: A partially characterized 5.3 x 4.2 x 5.2 cm 3
Hounsfield unit cyst in the right adnexa probably an exophytic
ovarian cyst versus a mesenteric cyst. A 3 cm cyst was present in
the same region on both prior studies.

Other: Small periumbilical herniation of fat without strangulation
or incarceration.

Musculoskeletal: Small right femoral head bone island. Minimal
vertebral body degenerative endplate change in the lower thoracic
spine.
IMPRESSION: Imaging findings that would support a clinical diagnosis of an acute
nonspecific cystitis with probable small blood clot within the
urinary bladder lumen. Normal bilateral kidneys and ureters.

A 5.3 cm cyst in the right adnexa probably an exophytic ovarian cyst
versus a mesenteric cyst. A 3 cm cyst was present in this region on
the prior 2608 and 9227 studies. Further characterization with
pelvis ultrasonography recommended.

Mild hepatomegaly and fatty infiltration of the liver with probable
focal fatty sparing in the gallbladder fossa.

Small hiatal hernia.

## 2022-06-20 ENCOUNTER — Ambulatory Visit: Payer: Self-pay | Admitting: Physician Assistant

## 2022-07-04 ENCOUNTER — Ambulatory Visit: Payer: Self-pay | Admitting: Physician Assistant

## 2022-11-07 ENCOUNTER — Other Ambulatory Visit (HOSPITAL_COMMUNITY): Payer: Self-pay | Admitting: Obstetrics and Gynecology

## 2022-11-07 DIAGNOSIS — Z1231 Encounter for screening mammogram for malignant neoplasm of breast: Secondary | ICD-10-CM

## 2022-11-30 ENCOUNTER — Inpatient Hospital Stay: Payer: Self-pay | Attending: Obstetrics and Gynecology | Admitting: Hematology and Oncology

## 2022-11-30 ENCOUNTER — Other Ambulatory Visit: Payer: Self-pay

## 2022-11-30 ENCOUNTER — Encounter (HOSPITAL_COMMUNITY): Payer: Self-pay

## 2022-11-30 ENCOUNTER — Ambulatory Visit (HOSPITAL_COMMUNITY)
Admission: RE | Admit: 2022-11-30 | Discharge: 2022-11-30 | Disposition: A | Discharge status: Home / Self Care | Payer: Self-pay | Source: Ambulatory Visit | Attending: Obstetrics and Gynecology | Admitting: Obstetrics and Gynecology

## 2022-11-30 VITALS — BP 116/51 | Wt 262.8 lb

## 2022-11-30 DIAGNOSIS — Z01419 Encounter for gynecological examination (general) (routine) without abnormal findings: Secondary | ICD-10-CM

## 2022-11-30 DIAGNOSIS — Z1231 Encounter for screening mammogram for malignant neoplasm of breast: Secondary | ICD-10-CM | POA: Insufficient documentation

## 2022-11-30 NOTE — Patient Instructions (Addendum)
Taught Eileen [name] about self breast awareness and gave educational materials to take home. Patient did not need a Pap smear today due to last Pap smear was in 11/16/2015 per patient. Let her know [facility] will cover Pap smears every 5 years unless has a history of abnormal Pap smears. [facility] to [facility] for screening mammogram. Appointment scheduled for 11/30/2022. Patient aware of appointment and will be there. Let patient know will follow up with her within the next couple weeks with results. Eileen [name] verbalized understanding.  Pascal Lux, NP 9:47 AM

## 2022-11-30 NOTE — Progress Notes (Signed)
[name] is a 42 y.o. [id number] female who presents to [facility] clinic today with no complaints.    Pap Smear: Pap smear completed today. Last Pap smear was 11/16/2015 at [facility] clinic and was normal. Per patient has no history of an abnormal Pap smear. Last Pap smear result is available in Epic.   Physical exam: Breasts Breasts symmetrical. No skin abnormalities bilateral breasts. No nipple retraction bilateral breasts. No nipple discharge bilateral breasts. No lymphadenopathy. No lumps palpated bilateral breasts.   MS DIGITAL SCREENING TOMO BILATERAL  Result Date: 08/11/2021 CLINICAL DATA:  Screening. EXAM: DIGITAL SCREENING BILATERAL MAMMOGRAM WITH TOMOSYNTHESIS AND CAD TECHNIQUE: Bilateral screening digital craniocaudal and mediolateral oblique mammograms were obtained. Bilateral screening digital breast tomosynthesis was performed. The images were evaluated with computer-aided detection. COMPARISON:  None available. ACR Breast Density Category a: The breast tissue is almost entirely fatty. FINDINGS: There are no findings suspicious for malignancy. IMPRESSION: No mammographic evidence of malignancy. A result letter of this screening mammogram will be mailed directly to the patient. RECOMMENDATION: Screening mammogram in one year. (Code:SM-B-01Y) BI-RADS CATEGORY  1: Negative. Electronically Signed   By: Norva Pavlov M.D.   On: 08/11/2021 12:47        Pelvic/Bimanual Ext Genitalia No lesions, no swelling and no discharge observed on external genitalia.        Vagina Vagina pink and normal texture. No lesions or discharge observed in vagina.        Cervix Cervix is present. Cervix pink and of normal texture. No discharge observed.    Uterus Uterus is present and palpable. Uterus in normal position and normal size.        Adnexae Bilateral ovaries present and palpable. No tenderness on palpation.         Rectovaginal No rectal exam completed today since patient had  no rectal complaints. No skin abnormalities observed on exam.     Smoking History: Patient has never smoked and was not referred to quit line.    Patient Navigation: Patient education provided. Access to services provided for patient through [facility] program. No interpreter provided. No transportation provided   Colorectal Cancer Screening: Per patient has never had colonoscopy completed No complaints today.    Breast and Cervical Cancer Risk Assessment: Patient does not have family history of breast cancer, known genetic mutations, or radiation treatment to the chest before age 61. Patient does not have history of cervical dysplasia, immunocompromised, or DES exposure in-utero.  Risk Scores as of Encounter on 11/30/2022     Eileen [name]           5-year 0.91%   Lifetime 13.36%   This patient is Hisp[place]/Latina but has no documented birth country, so the [place] model used data from [place] patients to calculate their risk score. Document a birth country in the Demographics activity for a more accurate score.         Last calculated by Narda Rutherford, LPN on 14/78/2956 at 10:31 AM        A: [facility] exam with pap smear No complaints with benign exam.   P: Referred patient to the [facility] for a screening mammogram. Appointment scheduled 11/30/2022.  Pascal Lux, NP 11/30/2022 10:47 AM

## 2022-12-03 LAB — CYTOLOGY - PAP
Adequacy: ABSENT
Comment: NEGATIVE
Diagnosis: NEGATIVE
High risk HPV: NEGATIVE

## 2022-12-20 ENCOUNTER — Telehealth: Payer: Self-pay

## 2022-12-20 NOTE — Telephone Encounter (Signed)
Attempted call with interpreter Id# [phone] to follow up with [facility] client /enrollment expired and to follow up after recent loss of her spouse.  No answer. Appears client has established care with [facility] and next appointment 03/19/23.  Will continue to reach out to [facility] to determine any needs for resources.   Francee Nodal RN [employer]
